# Patient Record
Sex: Male | Born: 1965 | Race: White | Hispanic: No | Marital: Married | Smoking: Current every day smoker
Health system: Southern US, Community
[De-identification: ages and names within clinical notes are randomized; demographics above are authoritative.]

## PROBLEM LIST (undated history)

## (undated) DIAGNOSIS — E785 Hyperlipidemia, unspecified: Secondary | ICD-10-CM

## (undated) DIAGNOSIS — I10 Essential (primary) hypertension: Secondary | ICD-10-CM

## (undated) DIAGNOSIS — I2510 Atherosclerotic heart disease of native coronary artery without angina pectoris: Secondary | ICD-10-CM

## (undated) HISTORY — DX: Atherosclerotic heart disease of native coronary artery without angina pectoris: I25.10

## (undated) HISTORY — DX: Essential (primary) hypertension: I10

## (undated) HISTORY — DX: Hyperlipidemia, unspecified: E78.5

---

## 2013-02-02 DIAGNOSIS — I2510 Atherosclerotic heart disease of native coronary artery without angina pectoris: Secondary | ICD-10-CM

## 2013-02-02 HISTORY — DX: Atherosclerotic heart disease of native coronary artery without angina pectoris: I25.10

## 2013-02-02 HISTORY — PX: CORONARY ANGIOPLASTY WITH STENT PLACEMENT: SHX49

## 2013-06-12 ENCOUNTER — Encounter (HOSPITAL_BASED_OUTPATIENT_CLINIC_OR_DEPARTMENT_OTHER): Payer: Self-pay | Admitting: Emergency Medicine

## 2013-06-12 ENCOUNTER — Emergency Department (HOSPITAL_BASED_OUTPATIENT_CLINIC_OR_DEPARTMENT_OTHER)
Admission: EM | Admit: 2013-06-12 | Discharge: 2013-06-12 | Disposition: A | Discharge status: Home / Self Care | Payer: No Typology Code available for payment source | Attending: Emergency Medicine | Admitting: Emergency Medicine

## 2013-06-12 DIAGNOSIS — Z7982 Long term (current) use of aspirin: Secondary | ICD-10-CM | POA: Insufficient documentation

## 2013-06-12 DIAGNOSIS — L509 Urticaria, unspecified: Secondary | ICD-10-CM | POA: Insufficient documentation

## 2013-06-12 DIAGNOSIS — F17200 Nicotine dependence, unspecified, uncomplicated: Secondary | ICD-10-CM | POA: Insufficient documentation

## 2013-06-12 DIAGNOSIS — Z79899 Other long term (current) drug therapy: Secondary | ICD-10-CM | POA: Insufficient documentation

## 2013-06-12 MED ORDER — PREDNISONE 50 MG PO TABS
60.0000 mg | ORAL_TABLET | Freq: Once | ORAL | Status: AC
  Administered 2013-06-12: 60 mg via ORAL
  Filled 2013-06-12: qty 1

## 2013-06-12 MED ORDER — FAMOTIDINE 20 MG PO TABS: 20.0000 mg | ORAL_TABLET | Freq: Two times a day (BID) | ORAL | Status: DC

## 2013-06-12 MED ORDER — PREDNISONE 20 MG PO TABS: ORAL_TABLET | ORAL | Status: DC

## 2013-06-12 MED ORDER — DIPHENHYDRAMINE HCL 25 MG PO TABS: 25.0000 mg | ORAL_TABLET | Freq: Four times a day (QID) | ORAL | Status: DC

## 2013-06-12 MED ORDER — FAMOTIDINE 20 MG PO TABS
20.0000 mg | ORAL_TABLET | Freq: Once | ORAL | Status: AC
  Administered 2013-06-12: 20 mg via ORAL
  Filled 2013-06-12: qty 1

## 2013-06-12 MED ORDER — DIPHENHYDRAMINE HCL 25 MG PO CAPS
25.0000 mg | ORAL_CAPSULE | Freq: Once | ORAL | Status: AC
  Administered 2013-06-12: 25 mg via ORAL
  Filled 2013-06-12: qty 1

## 2013-06-12 NOTE — ED Provider Notes (Signed)
Medical screening examination/treatment/procedure(s) were performed by non-physician practitioner and as supervising physician I was immediately available for consultation/collaboration.   Hoa Chistine Dematteo, MD 06/12/13 2214 

## 2013-06-12 NOTE — ED Provider Notes (Signed)
History    CSN: [id number] Arrival date & time 06/12/13  1659  First MD Initiated Contact with Patient 06/12/13 1707     Chief Complaint  Patient presents with  . Rash   (Consider location/radiation/quality/duration/timing/severity/associated sxs/prior Treatment) HPI  47 year old male presents for evaluation of hives.  Patient first noticed hives to both of his hands 2 days ago. Hives associate with redness and swelling to hands.  He also noticed hives to his left abdomen/left groin at the same time. Describe severe itching, minimally improved with taking Benadryl. Initially patient felt nauseated without vomiting or diarrhea but that has improved. No complaint of fever, headache, neck stiffness, chest pain, short of breath, trouble swallowing, throat swelling. Aside from pain.lays over the weekend he denies any other changes. Denies similar reaction in the past. No recent medication change, and no change in soap, detergent. He did recall trimming his dog hair over the weekend. Patient is here for further evaluation of the hives, however sts symptoms is improving.  No history of diabetes. Patient is currently on blood thinner medication due to prior cardiac stenting. Has been taking medication for the past several months without complications.   History reviewed. No pertinent past medical history. Past Surgical History  Procedure Laterality Date  . Coronary angioplasty with stent placement     No family history on file. History  Substance Use Topics  . Smoking status: Current Some Day Smoker  . Smokeless tobacco: Not on file  . Alcohol Use: Yes    Review of Systems  All other systems reviewed and are negative.    Allergies  Review of patient's allergies indicates no known allergies.  Home Medications   Current Outpatient Rx  Name  Route  Sig  Dispense  Refill  . aspirin 81 MG tablet   Oral   Take 81 mg by mouth daily.         Marland Kitchen atorvastatin (LIPITOR) 80 MG tablet  Oral   Take 80 mg by mouth at bedtime.         Marland Kitchen lisinopril (PRINIVIL,ZESTRIL) 20 MG tablet   Oral   Take 20 mg by mouth daily.         . metoprolol succinate (TOPROL-XL) 50 MG 24 hr tablet   Oral   Take 50 mg by mouth 2 (two) times daily. Take with or immediately following a meal.         . Ticagrelor (BRILINTA) 90 MG TABS tablet   Oral   Take 90 mg by mouth 2 (two) times daily.          BP 129/87  Pulse 81  Temp(Src) 97.6 F (36.4 C) (Oral)  Resp 16  Ht 6' (1.829 m)  Wt 226 lb (102.513 kg)  BMI 30.64 kg/m2  SpO2 97% Physical Exam  Nursing note and vitals reviewed. Constitutional: He is oriented to person, place, and time. He appears well-developed and well-nourished. No distress.  HENT:  Head: Atraumatic.  Mouth/Throat: Oropharynx is clear and moist.  No mucosal edema, no airway compromise  Eyes: Conjunctivae are normal.  Cardiovascular: Normal rate and regular rhythm.   Pulmonary/Chest: Effort normal and breath sounds normal. No respiratory distress. He has no wheezes.  Abdominal: Soft. There is no tenderness.  Musculoskeletal: Normal range of motion.  Neurological: He is alert and oriented to person, place, and time.  Skin: Rash (Hives noticed on both hands extending to the wrist, and also to left low anterior abdomen and left groin, and also to  R lower leg. No petechia, vesicular, or pustular lesion. Nontender on palpation.) noted.  Large ecchymosis noted to R lower thigh, pt aware and sts it's from taking blood thinner medication    ED Course  Procedures (including critical care time)  5:35 PM Patient with evidence of hives due to unknown causes. He is nontoxic in appearance with no airway compromise. I have reviewed this visit medication and does not think that his medication is responsible for this reaction. My plan is to give patient Benadryl, Pepcid, and prednisone as treatment. Care discussed with attending. Patient agrees to follow up with PCP for  further evaluation. Strict return precautions discussed. All questions answered to patient's satisfaction.  Labs Reviewed - No data to display No results found. 1. Localized hives     MDM  BP 129/87  Pulse 81  Temp(Src) 97.6 F (36.4 C) (Oral)  Resp 16  Ht 6' (1.829 m)  Wt 226 lb (102.513 kg)  BMI 30.64 kg/m2  SpO2 97%   Fayrene Helper, PA-C 06/12/13 1739

## 2013-06-12 NOTE — ED Notes (Addendum)
Redness, pain and swelling on hands and low trunk since yesterday. No hx of similar reaction. Last took Benadryl at 1500.

## 2013-09-27 ENCOUNTER — Ambulatory Visit (INDEPENDENT_AMBULATORY_CARE_PROVIDER_SITE_OTHER): Payer: No Typology Code available for payment source | Admitting: Cardiovascular Disease

## 2013-09-27 ENCOUNTER — Encounter: Payer: Self-pay | Admitting: Cardiovascular Disease

## 2013-09-27 VITALS — BP 108/74 | HR 64 | Ht 71.0 in | Wt 223.0 lb

## 2013-09-27 DIAGNOSIS — Z0389 Encounter for observation for other suspected diseases and conditions ruled out: Secondary | ICD-10-CM

## 2013-09-27 DIAGNOSIS — I10 Essential (primary) hypertension: Secondary | ICD-10-CM

## 2013-09-27 DIAGNOSIS — I2510 Atherosclerotic heart disease of native coronary artery without angina pectoris: Secondary | ICD-10-CM

## 2013-09-27 DIAGNOSIS — E785 Hyperlipidemia, unspecified: Secondary | ICD-10-CM | POA: Insufficient documentation

## 2013-09-27 NOTE — Patient Instructions (Addendum)
Your physician wants you to follow-up in: 6 MONTHS with Dr Excell Seltzer.  You will receive a reminder letter in the mail two months in advance. If you don't receive a letter, please call our office to schedule the follow-up appointment.  Your physician recommends that you return for a FASTING LIPID, LIVER and BMP--nothing to eat or drink after midnight, lab opens at 7:30.   You have been referred to Primary Care at [facility] (Dr Drue Novel)

## 2013-09-27 NOTE — Progress Notes (Signed)
HPI:  47 year-old gentleman presenting for cardiac evaluation. 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 He was treated with PCI of the left circumflex and had nonobstructive CAD elsewhere. Based on his description of symptoms, he presented late into the course of his event. MI occurred when he was living in [place], [place] and he has recently relocated to [place].   Records reviewed and show that he was treated with a DES (3.0x20 mm Promus post-dil to 3.5). No documentation of LVEF.   The patient has tolerated post-MI med Rx well. He is on ASA and brilinta for antiplatelet Rx.   From a symptomatic perspective he is doing well without complaints. No chest pain, dyspnea, edema, palpitations, lightheadedness, or syncope. He walks for exercise without exertional symptoms.  Outpatient Encounter Prescriptions as of 09/27/2013  Medication Sig Dispense Refill  . aspirin 81 MG tablet Take 81 mg by mouth daily.      Marland Kitchen esomeprazole (NEXIUM) 20 MG capsule Take 20 mg by mouth daily before breakfast. Take one tab      . lisinopril (PRINIVIL,ZESTRIL) 20 MG tablet Take 20 mg by mouth daily.      . metoprolol succinate (TOPROL-XL) 50 MG 24 hr tablet Take 50 mg by mouth 2 (two) times daily. Take with or immediately following a meal.      . Multiple Vitamin (MULTIVITAMIN) tablet Take 1 tablet by mouth daily. One tab      . potassium gluconate (EQL POTASSIUM GLUCONATE) 595 MG TABS tablet Take 595 mg by mouth daily. ONE TAB DAILY      . predniSONE (DELTASONE) 20 MG tablet 3 Tabs PO Days 1-3, then 2 tabs PO Days 4-6, then 1 tab PO Day 7-9, then Half Tab PO Day 10-12  20 tablet  0  . rosuvastatin (CRESTOR) 20 MG tablet Take 20 mg by mouth daily. ONE TAB      . Ticagrelor (BRILINTA) 90 MG TABS tablet Take 90 mg by mouth 2 (two) times daily.      . diphenhydrAMINE (BENADRYL) 25 MG tablet Take 1 tablet (25 mg total) by mouth every 6 (six) hours.  20 tablet  0  . [DISCONTINUED]  atorvastatin (LIPITOR) 80 MG tablet Take 80 mg by mouth at bedtime.      . [DISCONTINUED] famotidine (PEPCID) 20 MG tablet Take 1 tablet (20 mg total) by mouth 2 (two) times daily.  30 tablet  0   No facility-administered encounter medications on file as of 09/27/2013.    Review of patient's allergies indicates no known allergies.  Past Medical History  Diagnosis Date  . Coronary atherosclerosis of native coronary artery March 2014    inferoposterior MI, DES - left circumflex  . HTN (hypertension)   . Hyperlipidemia     Past Surgical History  Procedure Laterality Date  . Coronary angioplasty with stent placement      History   Social History  . Marital Status: Single    Spouse Name: N/A    Number of Children: N/A  . Years of Education: N/A   Occupational History  . Not on file.   Social History Main Topics  . Smoking status: Current Some Day Smoker  . Smokeless tobacco: Not on file  . Alcohol Use: Yes  . Drug Use: No  . Sexual Activity: Not on file   Other Topics Concern  . Not on file   Social History Narrative   Retired Public relations account executive. Quit smoking August  2014. 2-3 drinks per day. Exercises regularly. 4 children.    Family History  Problem Relation Age of Onset  . Coronary artery disease Father 73    died of MI    ROS:  General: no fevers/chills/night sweats Eyes: no blurry vision, diplopia, or amaurosis ENT: no sore throat or hearing loss Resp: no cough, wheezing, or hemoptysis CV: no edema or palpitations GI: no abdominal pain, nausea, vomiting, diarrhea, or constipation. Positive for reflux symptoms GU: no dysuria, frequency, or hematuria Skin: no rash Neuro: no headache, numbness, tingling, or weakness of extremities Musculoskeletal: no joint pain or swelling Heme: no bleeding, DVT, or easy bruising Endo: no polydipsia or polyuria Psych: positive for anxiety/depression  BP 108/74  Pulse 64  Ht 5\' 11"  (1.803 m)  Wt 223 lb (101.152 kg)   BMI 31.12 kg/m2  PHYSICAL EXAM: Pt is alert and oriented, WD, WN, in no distress. HEENT: normal Neck: JVP normal. Carotid upstrokes normal without bruits. No thyromegaly. Lungs: equal expansion, clear bilaterally CV: Apex is discrete and nondisplaced, RRR without murmur or gallop Abd: soft, NT, +BS, no bruit, no hepatosplenomegaly Back: no CVA tenderness Ext: no C/C/E        DP/PT pulses intact and = Skin: warm and dry without rash Neuro: CNII-XII intact             Strength intact = bilaterally  EKG:  Sinus rhythm 64 beats per minute, age-indeterminate inferoposterior MI.  ASSESSMENT AND PLAN: 1. CAD, native vessel. Stable without anginal symptoms. Med Rx reviewed and he is on appropriate meds. He should continue brilinta through March 2015 and I will see him in follow-up before discontinuation of this. LVEF not documented but no evidence of significant problems. He is Class 1 and seems to be completely asymptomatic on beta-blocker/ACE.  2. HTN - BP controlled on metoprolol and lisinopril.  3. Hyperlipidemia - on crestor. Will check lipids and lft's prior to his return visit in 6 months.  4. Tobacco - recently quit.    For follow-up I will see him in 6 months. Will write for med refills when needed. Pt given names of PCP's in area.   Tonny Bollman 09/29/2013 11:45 PM

## 2013-09-29 ENCOUNTER — Encounter: Payer: Self-pay | Admitting: Cardiovascular Disease

## 2013-10-25 ENCOUNTER — Other Ambulatory Visit (INDEPENDENT_AMBULATORY_CARE_PROVIDER_SITE_OTHER): Payer: No Typology Code available for payment source

## 2013-10-25 DIAGNOSIS — I2510 Atherosclerotic heart disease of native coronary artery without angina pectoris: Secondary | ICD-10-CM

## 2013-10-25 DIAGNOSIS — Z0389 Encounter for observation for other suspected diseases and conditions ruled out: Secondary | ICD-10-CM

## 2013-10-25 DIAGNOSIS — E785 Hyperlipidemia, unspecified: Secondary | ICD-10-CM

## 2013-10-25 DIAGNOSIS — I10 Essential (primary) hypertension: Secondary | ICD-10-CM

## 2013-10-25 LAB — HEPATIC FUNCTION PANEL
ALT: 49 U/L (ref 0–53)
AST: 38 U/L — ABNORMAL HIGH (ref 0–37)
Alkaline Phosphatase: 58 U/L (ref 39–117)
Bilirubin, Direct: 0.1 mg/dL (ref 0.0–0.3)
Total Bilirubin: 0.9 mg/dL (ref 0.3–1.2)

## 2013-10-25 LAB — BASIC METABOLIC PANEL
BUN: 16 mg/dL (ref 6–23)
Calcium: 9.6 mg/dL (ref 8.4–10.5)
Creatinine, Ser: 1.2 mg/dL (ref 0.4–1.5)
GFR: 66.13 mL/min (ref >60.00)
Glucose, Bld: 111 mg/dL — ABNORMAL HIGH (ref 70–99)
Potassium: 4.6 mEq/L (ref 3.5–5.1)

## 2013-10-28 ENCOUNTER — Other Ambulatory Visit: Payer: Self-pay | Admitting: *Deleted

## 2013-10-28 ENCOUNTER — Telehealth: Payer: Self-pay | Admitting: *Deleted

## 2013-10-28 MED ORDER — TICAGRELOR 90 MG PO TABS: 90.0000 mg | ORAL_TABLET | Freq: Two times a day (BID) | ORAL | Status: DC

## 2013-10-28 MED ORDER — ROSUVASTATIN CALCIUM 20 MG PO TABS: 20.0000 mg | ORAL_TABLET | Freq: Every day | ORAL | Status: DC

## 2013-10-28 MED ORDER — LISINOPRIL 20 MG PO TABS: 20.0000 mg | ORAL_TABLET | Freq: Every day | ORAL | Status: DC

## 2013-10-28 MED ORDER — METOPROLOL TARTRATE 100 MG PO TABS: 100.0000 mg | ORAL_TABLET | Freq: Two times a day (BID) | ORAL | Status: DC

## 2013-10-28 NOTE — Telephone Encounter (Signed)
Complete

## 2013-10-28 NOTE — Telephone Encounter (Signed)
Patient called for metoprolol refill. Our office list says metop succ 50mg  bid, but patient states that he takes metop tart 100mg  bid. Please advise. Thanks, MI

## 2013-10-28 NOTE — Telephone Encounter (Signed)
Please update the pt's medication list with Metoprolol Tartrate 100mg  bid (D/C Metoprolol Succinate) and this is okay to refill. Thank you.

## 2013-12-12 ENCOUNTER — Telehealth: Payer: Self-pay

## 2013-12-12 NOTE — Telephone Encounter (Signed)
Medication List and allergies:  Reviewed and updated  90 day supply/mail order: [facility] [address] Local prescriptions:   Immunizations due: declines flu  A/P:   No changes to FH or PSH or personal hx Tdap--5-7 years ago  To Discuss with Provider: Vitamins, healthy living, working out

## 2013-12-13 ENCOUNTER — Encounter: Payer: Self-pay | Admitting: Family Medicine

## 2013-12-13 ENCOUNTER — Encounter: Payer: Self-pay | Admitting: Gastroenterology

## 2013-12-13 ENCOUNTER — Ambulatory Visit (INDEPENDENT_AMBULATORY_CARE_PROVIDER_SITE_OTHER): Payer: No Typology Code available for payment source | Admitting: Family Medicine

## 2013-12-13 VITALS — BP 120/76 | HR 69 | Temp 97.8°F | Resp 16 | Ht 72.0 in | Wt 235.0 lb

## 2013-12-13 DIAGNOSIS — I2510 Atherosclerotic heart disease of native coronary artery without angina pectoris: Secondary | ICD-10-CM

## 2013-12-13 DIAGNOSIS — R1310 Dysphagia, unspecified: Secondary | ICD-10-CM | POA: Insufficient documentation

## 2013-12-13 DIAGNOSIS — I10 Essential (primary) hypertension: Secondary | ICD-10-CM

## 2013-12-13 DIAGNOSIS — E669 Obesity, unspecified: Secondary | ICD-10-CM

## 2013-12-13 DIAGNOSIS — R1319 Other dysphagia: Secondary | ICD-10-CM | POA: Insufficient documentation

## 2013-12-13 DIAGNOSIS — E785 Hyperlipidemia, unspecified: Secondary | ICD-10-CM

## 2013-12-13 DIAGNOSIS — K219 Gastro-esophageal reflux disease without esophagitis: Secondary | ICD-10-CM | POA: Insufficient documentation

## 2013-12-13 DIAGNOSIS — R1314 Dysphagia, pharyngoesophageal phase: Secondary | ICD-10-CM

## 2013-12-13 DIAGNOSIS — Z23 Encounter for immunization: Secondary | ICD-10-CM

## 2013-12-13 LAB — H. PYLORI ANTIBODY, IGG: H PYLORI IGG: NEGATIVE

## 2013-12-13 LAB — HEMOGLOBIN A1C: Hgb A1c MFr Bld: 6 % (ref 4.6–6.5)

## 2013-12-13 LAB — BASIC METABOLIC PANEL
BUN: 16 mg/dL (ref 6–23)
CHLORIDE: 103 meq/L (ref 96–112)
CO2: 30 mEq/L (ref 19–32)
Calcium: 9.1 mg/dL (ref 8.4–10.5)
Creatinine, Ser: 1.3 mg/dL (ref 0.4–1.5)
GFR: 65.48 mL/min (ref >60.00)
GLUCOSE: 115 mg/dL — AB (ref 70–99)
POTASSIUM: 4.1 meq/L (ref 3.5–5.1)
SODIUM: 139 meq/L (ref 135–145)

## 2013-12-13 MED ORDER — PANTOPRAZOLE SODIUM 40 MG PO TBEC: 40.0000 mg | DELAYED_RELEASE_TABLET | Freq: Every day | ORAL | Status: DC

## 2013-12-13 NOTE — Patient Instructions (Signed)
Schedule your complete physical in 6 months We'll notify you of your lab results and make any changes if needed Make healthy food choices and regular exercise Call with any questions or concerns Welcome!  We're glad to have you!

## 2013-12-13 NOTE — Assessment & Plan Note (Signed)
New.  Pt has gained weight since stopping smoking.  Plans to focus on healthy diet and regular exercise.  Applauded his commitment.  Will follow.

## 2013-12-13 NOTE — Progress Notes (Signed)
   Subjective:    Patient ID: Norman Anderson, male    DOB: 07/04/1966, 48 y.o.   MRN: [id number]  HPI New to establish.  Recently moved to area.  MI- occurred in 3/14.  Now seeing Dr Burt Knack.  On Brilinta, Crestor, Lisinopril, and Metoprolol.  No recent CP.  Intermittent SOB- has gained weight since QUITTING SMOKING!!  Denies HAs, visual changes.  GERD- on Nexium but has been having severe sxs 'for a couple months'.  Reports he is regularly having difficulty swallowing food or water, 'it just ties up right in my chest'.  GERD is waking pt at night, occuring regardless of what he eats.  Obesity- pt has gained weight since quitting smoking.  Plans to re-focus on healthy diet and regular exercise.   Review of Systems For ROS see HPI     Objective:   Physical Exam  Vitals reviewed. Constitutional: He is oriented to person, place, and time. He appears well-developed and well-nourished. No distress.  HENT:  Head: Normocephalic and atraumatic.  Eyes: Conjunctivae and EOM are normal. Pupils are equal, round, and reactive to light.  Neck: Normal range of motion. Neck supple. No thyromegaly present.  Cardiovascular: Normal rate, regular rhythm, normal heart sounds and intact distal pulses.   No murmur heard. Pulmonary/Chest: Effort normal and breath sounds normal. No respiratory distress.  Abdominal: Soft. Bowel sounds are normal. He exhibits no distension.  Musculoskeletal: He exhibits no edema.  Lymphadenopathy:    He has no cervical adenopathy.  Neurological: He is alert and oriented to person, place, and time. No cranial nerve deficit.  Skin: Skin is warm and dry.  Psychiatric: He has a normal mood and affect. His behavior is normal.          Assessment & Plan:

## 2013-12-13 NOTE — Progress Notes (Signed)
Pre visit review using our clinic review tool, if applicable. No additional management support is needed unless otherwise documented below in the visit note. 

## 2013-12-13 NOTE — Assessment & Plan Note (Signed)
New.  tx GERD and refer to GI.  Will follow.

## 2013-12-13 NOTE — Assessment & Plan Note (Signed)
New to provider, ongoing for pt.  Following w/ Dr Copper.  Will follow along and assist w/ risk reduction as much as possible.

## 2013-12-13 NOTE — Assessment & Plan Note (Signed)
Chronic problem.  Tolerating statin w/out difficulty.  Reviewed recent labs.  Will follow.

## 2013-12-13 NOTE — Assessment & Plan Note (Signed)
New to provider, ongoing for pt.  On Nexium 20mg  w/out relief.  Switch to Protonix and increase to 40mg  daily.  Reviewed dietary and lifestyle modifications

## 2013-12-13 NOTE — Assessment & Plan Note (Signed)
New to provider, chronic for pt.  Adequate control.  Asymptomatic.  Will follow- no changes.

## 2013-12-23 ENCOUNTER — Telehealth: Payer: Self-pay | Admitting: *Deleted

## 2013-12-23 ENCOUNTER — Ambulatory Visit (INDEPENDENT_AMBULATORY_CARE_PROVIDER_SITE_OTHER): Payer: No Typology Code available for payment source | Admitting: Gastroenterology

## 2013-12-23 ENCOUNTER — Encounter: Payer: Self-pay | Admitting: Gastroenterology

## 2013-12-23 VITALS — BP 92/62 | HR 64 | Ht 72.0 in | Wt 233.0 lb

## 2013-12-23 DIAGNOSIS — K219 Gastro-esophageal reflux disease without esophagitis: Secondary | ICD-10-CM

## 2013-12-23 DIAGNOSIS — R1310 Dysphagia, unspecified: Secondary | ICD-10-CM

## 2013-12-23 DIAGNOSIS — R1319 Other dysphagia: Secondary | ICD-10-CM

## 2013-12-23 DIAGNOSIS — R1314 Dysphagia, pharyngoesophageal phase: Secondary | ICD-10-CM

## 2013-12-23 NOTE — Progress Notes (Signed)
_                                                                                                                History of Present Illness: 48 year old white male with history of coronary artery disease, status post placement of a drug-eluting stent in March, 2014, on Brelinta, referred for evaluation of dysphagia.  For several months he's had dysphagia to solids and, more recently, liquids.  He complains of pyrosis which is partially relieved with Protonix.  He's had what sounds like minor food impactions.  Weight is stable.    Past Medical History  Diagnosis Date  . Coronary atherosclerosis of native coronary artery March 2014    inferoposterior MI, DES - left circumflex  . HTN (hypertension)   . Hyperlipidemia    Past Surgical History  Procedure Laterality Date  . Coronary angioplasty with stent placement     family history includes Coronary artery disease (age of onset: 27) in his father; Heart attack in his father. Current Outpatient Prescriptions  Medication Sig Dispense Refill  . Ascorbic Acid (VITAMIN C PO) Take by mouth.      Marland Kitchen aspirin 81 MG tablet Take 81 mg by mouth daily.      . B Complex-C (SUPER B COMPLEX PO) Take by mouth.      . esomeprazole (NEXIUM) 20 MG capsule Take 20 mg by mouth daily before breakfast. Take one tab      . Flaxseed, Linseed, (FLAXSEED OIL PO) Take by mouth.      Marland Kitchen lisinopril (PRINIVIL,ZESTRIL) 20 MG tablet Take 1 tablet (20 mg total) by mouth daily.  90 tablet  1  . metoprolol (LOPRESSOR) 100 MG tablet Take 1 tablet (100 mg total) by mouth 2 (two) times daily.  180 tablet  1  . Multiple Vitamin (MULTIVITAMIN) tablet Take 1 tablet by mouth daily. One tab      . pantoprazole (PROTONIX) 40 MG tablet Take 1 tablet (40 mg total) by mouth daily.  30 tablet  3  . potassium gluconate (EQL POTASSIUM GLUCONATE) 595 MG TABS tablet Take 595 mg by mouth daily. ONE TAB DAILY      . rosuvastatin (CRESTOR) 20 MG tablet Take 1 tablet (20 mg  total) by mouth daily. ONE TAB  90 tablet  1  . Ticagrelor (BRILINTA) 90 MG TABS tablet Take 1 tablet (90 mg total) by mouth 2 (two) times daily.  180 tablet  1  . WHEY PROTEIN PO Take by mouth.       No current facility-administered medications for this visit.   Allergies as of 12/23/2013  . (No Known Allergies)    reports that he quit smoking about 5 months ago. His smoking use included Cigarettes. He smoked 0.00 packs per day. He has never used smokeless tobacco. He reports that he drinks alcohol. He reports that he does not use illicit drugs.     Review of Systems: Pertinent positive and negative review of systems were noted in the above HPI section. All  other review of systems were otherwise negative.  Vital signs were reviewed in today's medical record Physical Exam: General: Well developed , well nourished, no acute distress Skin: anicteric Head: Normocephalic and atraumatic Eyes:  sclerae anicteric, EOMI Ears: Normal auditory acuity Mouth: No deformity or lesions Neck: Supple, no masses or thyromegaly Lungs: Clear throughout to auscultation Heart: Regular rate and rhythm; no murmurs, rubs or bruits Abdomen: Soft, non tender and non distended. No masses, hepatosplenomegaly or hernias noted. Normal Bowel sounds Rectal:deferred Musculoskeletal: Symmetrical with no gross deformities  Skin: No lesions on visible extremities Pulses:  Normal pulses noted Extremities: No clubbing, cyanosis, edema or deformities noted Neurological: Alert oriented x 4, grossly nonfocal Cervical Nodes:  No significant cervical adenopathy Inguinal Nodes: No significant inguinal adenopathy Psychological:  Alert and cooperative. Normal mood and affect  See Assessment and Plan under Problem List

## 2013-12-23 NOTE — Telephone Encounter (Signed)
  12/23/2013   RE: Norman Anderson DOB: Dec 07, 1965 MRN: [id number]   Dear Burt Knack,    We have scheduled the above patient for an endoscopic procedure. Our records show that he is on anticoagulation therapy.   Please advise as to how long the patient may come off his therapy of Brilinta prior to the procedure, which is scheduled for 01/16/2014.  Please fax back/ or route the completed form to [facility] at [phone].   Sincerely,    Genella Mech

## 2013-12-23 NOTE — Patient Instructions (Signed)
You have been scheduled for an endoscopy with propofol. Please follow written instructions given to you at your visit today. If you use inhalers (even only as needed), please bring them with you on the day of your procedure. Your physician has requested that you go to www.startemmi.com and enter the access code given to you at your visit today. This web site gives a general overview about your procedure. However, you should still follow specific instructions given to you by our office regarding your preparation for the procedure.  PLEASE CONTACT OUR OFFICE IF YOU HAVE NOT HEARD FROM Norman Anderson WITHIN A WEEK OF YOUR PROCEDURE ABOUT HOLDING YOUR BRILINTA

## 2013-12-23 NOTE — Assessment & Plan Note (Signed)
Symptoms are well-controlled with protonix.

## 2013-12-23 NOTE — Telephone Encounter (Signed)
Pt had an MI treated with drug-eluting stent in March 2014. He should be on 12 months of ASA and brilinta without interruption unless endoscopy is urgent. After March 2015, he can hold Brilinta 5 days prior to endoscopy and resume when safe from bleeding risk perspective.   thx  Sherren Mocha 12/23/2013 4:30 PM

## 2013-12-23 NOTE — Assessment & Plan Note (Signed)
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  Recommendations #1 upper endoscopy with dilatation as indicated.  I'll check with the patient's cardiologist whether we can hold Brelinta prior to the procedure.  The risk of holding anticoagulation therapy or antiplatelet medications was discussed including the increased risk for thromboembolic disease that may include DVT, pulmonary emboli and stroke. The patient understands this risk and is willing to proceed with temporally holding the medication provided that this is approved by her PCP or cardiologist.

## 2013-12-23 NOTE — Telephone Encounter (Signed)
Dr Deatra Ina, the only way Dr Burt Knack is going to hold brilinta is if the EGD is urgent Otherwise he has to wait till after his therapy is complete What do you want to do??

## 2013-12-24 NOTE — Telephone Encounter (Signed)
Please convey Dr. Antionette Char comments to the patient.  We'll have to wait until he completes therapy in March.  He can schedule a dilitation anytime after March while holding Brelinta for 5 days..  If he can't manage with a soft diet have him contact me.

## 2013-12-25 NOTE — Telephone Encounter (Signed)
Tried to contact patient  Voice mailbox not set up

## 2013-12-31 NOTE — Telephone Encounter (Signed)
Explained to patient that we have to cancel his procedure and that he is to contact our office when he completes his blood thinner  Patient understands

## 2014-01-16 ENCOUNTER — Encounter: Payer: No Typology Code available for payment source | Admitting: Gastroenterology

## 2014-02-10 ENCOUNTER — Other Ambulatory Visit: Payer: Self-pay | Admitting: General Practice

## 2014-02-10 DIAGNOSIS — K219 Gastro-esophageal reflux disease without esophagitis: Secondary | ICD-10-CM

## 2014-02-10 MED ORDER — PANTOPRAZOLE SODIUM 40 MG PO TBEC: 40.0000 mg | DELAYED_RELEASE_TABLET | Freq: Every day | ORAL | Status: DC

## 2014-02-11 ENCOUNTER — Encounter (HOSPITAL_COMMUNITY): Payer: Self-pay | Admitting: Cardiovascular Disease

## 2014-05-02 ENCOUNTER — Other Ambulatory Visit: Payer: Self-pay | Admitting: Cardiovascular Disease

## 2014-05-22 ENCOUNTER — Encounter: Payer: Self-pay | Admitting: Cardiovascular Disease

## 2014-05-22 ENCOUNTER — Ambulatory Visit (INDEPENDENT_AMBULATORY_CARE_PROVIDER_SITE_OTHER): Payer: No Typology Code available for payment source | Admitting: Cardiovascular Disease

## 2014-05-22 VITALS — BP 124/72 | HR 56 | Ht 72.0 in | Wt 240.1 lb

## 2014-05-22 DIAGNOSIS — I2510 Atherosclerotic heart disease of native coronary artery without angina pectoris: Secondary | ICD-10-CM

## 2014-05-22 DIAGNOSIS — E785 Hyperlipidemia, unspecified: Secondary | ICD-10-CM

## 2014-05-22 DIAGNOSIS — I10 Essential (primary) hypertension: Secondary | ICD-10-CM

## 2014-05-22 NOTE — Progress Notes (Signed)
    HPI: 48 year old gentleman presenting for followup evaluation. 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 He was treated with PCI of the left circumflex and had nonobstructive CAD elsewhere. Based on his description of symptoms, he presented late into the course of his event.  The patient is doing very well. He discontinue brilinta when he was 12 months out from his MI. He is walking regularly for exercise. He has occasional discomfort in his left upper chest and shoulder when he walks, but he attributes this to an old crush injury. He really feels quite well and has no dyspnea, substernal pain, palpitations, or lightheadedness. He has no symptoms reminiscent of those at the time of his MI.  Outpatient Encounter Prescriptions as of 05/22/2014  Medication Sig  . Ascorbic Acid (VITAMIN C PO) Take by mouth.  Marland Kitchen aspirin 81 MG tablet Take 81 mg by mouth daily.  . B Complex-C (SUPER B COMPLEX PO) Take by mouth.  . Flaxseed, Linseed, (FLAXSEED OIL PO) Take by mouth.  Marland Kitchen lisinopril (PRINIVIL,ZESTRIL) 20 MG tablet Take 1 tablet (20 mg total) by mouth daily.  . metoprolol (LOPRESSOR) 100 MG tablet TAKE 1 TABLET BY MOUTH TWICE A DAY  . Multiple Vitamin (MULTIVITAMIN) tablet Take 1 tablet by mouth daily. One tab  . Omega 3-6-9 Fatty Acids (OMEGA 3-6-9 COMPLEX PO) Take 1,400 tablets by mouth once.  . pantoprazole (PROTONIX) 40 MG tablet Take 1 tablet (40 mg total) by mouth daily.  . potassium gluconate (EQL POTASSIUM GLUCONATE) 595 MG TABS tablet Take 595 mg by mouth daily. ONE TAB DAILY  . rosuvastatin (CRESTOR) 20 MG tablet Take 1 tablet (20 mg total) by mouth daily. ONE TAB  . WHEY PROTEIN PO Take by mouth.  . [DISCONTINUED] esomeprazole (NEXIUM) 20 MG capsule Take 20 mg by mouth daily before breakfast. Take one tab  . [DISCONTINUED] Ticagrelor (BRILINTA) 90 MG TABS tablet Take 1 tablet (90 mg total) by mouth 2 (two) times daily.    No Known Allergies  Past Medical  History  Diagnosis Date  . Coronary atherosclerosis of native coronary artery March 2014    inferoposterior MI, DES - left circumflex  . HTN (hypertension)   . Hyperlipidemia     ROS: Negative except as per HPI  BP 124/72  Pulse 56  Ht 6' (1.829 m)  Wt 108.918 kg (240 lb 1.9 oz)  BMI 32.56 kg/m2  PHYSICAL EXAM: Pt is alert and oriented, NAD HEENT: normal Neck: JVP - normal, carotids 2+= without bruits Lungs: CTA bilaterally CV: RRR without murmur or gallop Abd: soft, NT, Positive BS, no hepatomegaly Ext: no C/C/E, distal pulses intact and equal Skin: warm/dry no rash  EKG:  Sinus bradycardia 56 beats per minute, age-indeterminate inferoposterior MI.  ASSESSMENT AND PLAN: 1. Coronary artery disease, native vessel. The patient is stable without symptoms of angina. He will continue on his current medical program which includes aspirin, and ACE inhibitor, beta blocker, and a statin drug. No changes were made to his medical program today.  2. Essential hypertension. His blood pressure is well controlled on his current medical therapy.  3. Hyperlipidemia. The patient continues on Crestor. He will have upcoming lab work with his annual physical exam. We discussed the importance of diet and exercise as it relates to all of his cardiovascular problems.  I will plan on seeing him back in one year for followup evaluation.  Sherren Mocha 05/22/2014 6:19 PM

## 2014-05-22 NOTE — Patient Instructions (Signed)
Your physician wants you to follow-up in: 1 YEAR with Dr Cooper.  You will receive a reminder letter in the mail two months in advance. If you don't receive a letter, please call our office to schedule the follow-up appointment.  Your physician recommends that you continue on your current medications as directed. Please refer to the Current Medication list given to you today.  

## 2014-06-12 ENCOUNTER — Encounter: Payer: No Typology Code available for payment source | Admitting: Family Medicine

## 2014-08-03 ENCOUNTER — Other Ambulatory Visit: Payer: Self-pay | Admitting: Family Medicine

## 2014-08-03 ENCOUNTER — Other Ambulatory Visit: Payer: Self-pay | Admitting: Cardiovascular Disease

## 2014-08-04 NOTE — Telephone Encounter (Signed)
Med filled.  

## 2014-08-28 ENCOUNTER — Encounter: Payer: No Typology Code available for payment source | Admitting: Family Medicine

## 2014-11-04 ENCOUNTER — Other Ambulatory Visit: Payer: Self-pay | Admitting: Cardiovascular Disease

## 2015-02-01 ENCOUNTER — Other Ambulatory Visit: Payer: Self-pay | Admitting: Cardiovascular Disease

## 2015-02-02 ENCOUNTER — Telehealth: Payer: Self-pay | Admitting: Family Medicine

## 2015-02-02 ENCOUNTER — Other Ambulatory Visit: Payer: Self-pay | Admitting: General Practice

## 2015-02-02 MED ORDER — PANTOPRAZOLE SODIUM 40 MG PO TBEC: 40.0000 mg | DELAYED_RELEASE_TABLET | Freq: Every day | ORAL | Status: DC

## 2015-02-02 NOTE — Telephone Encounter (Signed)
Pt due for CPE- ok for #30, 1 refill

## 2015-02-02 NOTE — Telephone Encounter (Signed)
Called PT to make appointment for CPE per Dr. Virgil Benedict request- LM on home CBR

## 2015-02-02 NOTE — Telephone Encounter (Signed)
Last OV 12-13-13 Pantoprazole last filled 08/04/14 #90 with 1   No upcoming appts

## 2015-04-09 ENCOUNTER — Other Ambulatory Visit: Payer: Self-pay | Admitting: Family Medicine

## 2015-04-09 NOTE — Telephone Encounter (Signed)
Med filled 30 days only, pt needs a BP and cholesterol follow up before his CPE in September.

## 2015-04-26 ENCOUNTER — Other Ambulatory Visit: Payer: Self-pay | Admitting: Family Medicine

## 2015-04-27 NOTE — Telephone Encounter (Signed)
Med denied, pt is in need of a follow up appt before his CPE.

## 2015-04-29 ENCOUNTER — Encounter: Payer: Self-pay | Admitting: Family Medicine

## 2015-04-29 ENCOUNTER — Ambulatory Visit (INDEPENDENT_AMBULATORY_CARE_PROVIDER_SITE_OTHER): Payer: No Typology Code available for payment source | Admitting: Family Medicine

## 2015-04-29 VITALS — BP 122/80 | HR 67 | Temp 98.0°F | Resp 16 | Wt 255.5 lb

## 2015-04-29 DIAGNOSIS — I10 Essential (primary) hypertension: Secondary | ICD-10-CM | POA: Diagnosis not present

## 2015-04-29 DIAGNOSIS — K219 Gastro-esophageal reflux disease without esophagitis: Secondary | ICD-10-CM

## 2015-04-29 DIAGNOSIS — E785 Hyperlipidemia, unspecified: Secondary | ICD-10-CM

## 2015-04-29 MED ORDER — PANTOPRAZOLE SODIUM 40 MG PO TBEC: DELAYED_RELEASE_TABLET | ORAL | Status: DC

## 2015-04-29 NOTE — Progress Notes (Signed)
Pre visit review using our clinic review tool, if applicable. No additional management support is needed unless otherwise documented below in the visit note. 

## 2015-04-29 NOTE — Assessment & Plan Note (Signed)
Chronic problem.  Tolerating statin w/o difficulty.  Check labs.  Adjust meds prn  

## 2015-04-29 NOTE — Assessment & Plan Note (Signed)
Chronic problem.  Well controlled.  Asymptomatic.  Check labs.  No anticipated med changes. 

## 2015-04-29 NOTE — Progress Notes (Signed)
   Subjective:    Patient ID: Norman Anderson, male    DOB: 1966-05-29, 49 y.o.   MRN: [id number]  HPI HTN- chronic problem, on Lisinopril, Metoprolol.  No CP, SOB, HAs, visual changes, edema.  Walking 5 miles nightly  Hyperlipidemia- chronic problem, on Crestor.  No abd pain, N/V.  GERD- chronic problem, on Protonix.  sxs are well controlled on current meds but when pt stops meds, severe sxs return.  Has been up at night due to reflux.  when on meds, able to eat pretty much everything and able to sleep w/o difficulty.    Review of Systems For ROS see HPI     Objective:   Physical Exam  Constitutional: He is oriented to person, place, and time. He appears well-developed and well-nourished. No distress.  HENT:  Head: Normocephalic and atraumatic.  Eyes: Conjunctivae and EOM are normal. Pupils are equal, round, and reactive to light.  Neck: Normal range of motion. Neck supple. No thyromegaly present.  Cardiovascular: Normal rate, regular rhythm, normal heart sounds and intact distal pulses.   No murmur heard. Pulmonary/Chest: Effort normal and breath sounds normal. No respiratory distress.  Abdominal: Soft. Bowel sounds are normal. He exhibits no distension.  Musculoskeletal: He exhibits no edema.  Lymphadenopathy:    He has no cervical adenopathy.  Neurological: He is alert and oriented to person, place, and time. No cranial nerve deficit.  Skin: Skin is warm and dry.  Psychiatric: He has a normal mood and affect. His behavior is normal.  Vitals reviewed.         Assessment & Plan:

## 2015-04-29 NOTE — Assessment & Plan Note (Signed)
sxs are well controlled when on protonix but returned as soon as he stopped medication.  Refill provided today.  Reviewed dietary and lifestyle modifications.

## 2015-04-29 NOTE — Patient Instructions (Signed)
Follow up as scheduled for your physical We'll notify you of your lab results and make any changes if needed Keep up the good work!  You look great! Call with any questions or concerns Enjoy your holiday weekend!!!

## 2015-04-30 ENCOUNTER — Other Ambulatory Visit (INDEPENDENT_AMBULATORY_CARE_PROVIDER_SITE_OTHER): Payer: No Typology Code available for payment source

## 2015-04-30 DIAGNOSIS — R739 Hyperglycemia, unspecified: Secondary | ICD-10-CM

## 2015-04-30 LAB — HEPATIC FUNCTION PANEL
ALK PHOS: 60 U/L (ref 39–117)
ALT: 34 U/L (ref 0–53)
AST: 28 U/L (ref 0–37)
Albumin: 4.2 g/dL (ref 3.5–5.2)
BILIRUBIN DIRECT: 0.1 mg/dL (ref 0.0–0.3)
Total Bilirubin: 0.5 mg/dL (ref 0.2–1.2)
Total Protein: 7 g/dL (ref 6.0–8.3)

## 2015-04-30 LAB — CBC WITH DIFFERENTIAL/PLATELET
BASOS ABS: 0.1 10*3/uL (ref 0.0–0.1)
BASOS PCT: 0.8 % (ref 0.0–3.0)
EOS ABS: 0.2 10*3/uL (ref 0.0–0.7)
EOS PCT: 1.9 % (ref 0.0–5.0)
HCT: 43.7 % (ref 39.0–52.0)
HEMOGLOBIN: 14.8 g/dL (ref 13.0–17.0)
LYMPHS ABS: 1.9 10*3/uL (ref 0.7–4.0)
LYMPHS PCT: 21.9 % (ref 12.0–46.0)
MCHC: 33.9 g/dL (ref 30.0–36.0)
MCV: 91.1 fl (ref 78.0–100.0)
MONO ABS: 1 10*3/uL (ref 0.1–1.0)
Monocytes Relative: 11.8 % (ref 3.0–12.0)
NEUTROS ABS: 5.6 10*3/uL (ref 1.4–7.7)
Neutrophils Relative %: 63.6 % (ref 43.0–77.0)
Platelets: 270 10*3/uL (ref 150.0–400.0)
RBC: 4.79 Mil/uL (ref 4.22–5.81)
RDW: 13.4 % (ref 11.5–15.5)
WBC: 8.9 10*3/uL (ref 4.0–10.5)

## 2015-04-30 LAB — LIPID PANEL
CHOL/HDL RATIO: 3
CHOLESTEROL: 95 mg/dL (ref 0–200)
HDL: 37.8 mg/dL — ABNORMAL LOW (ref >39.00)
LDL Cholesterol: 38 mg/dL (ref 0–99)
NONHDL: 57.2
Triglycerides: 96 mg/dL (ref 0.0–149.0)
VLDL: 19.2 mg/dL (ref 0.0–40.0)

## 2015-04-30 LAB — BASIC METABOLIC PANEL
BUN: 16 mg/dL (ref 6–23)
CO2: 27 mEq/L (ref 19–32)
CREATININE: 1.14 mg/dL (ref 0.40–1.50)
Calcium: 9.5 mg/dL (ref 8.4–10.5)
Chloride: 105 mEq/L (ref 96–112)
GFR: 72.41 mL/min (ref >60.00)
Glucose, Bld: 110 mg/dL — ABNORMAL HIGH (ref 70–99)
POTASSIUM: 4.4 meq/L (ref 3.5–5.1)
Sodium: 137 mEq/L (ref 135–145)

## 2015-04-30 LAB — HEMOGLOBIN A1C: HEMOGLOBIN A1C: 5.8 % (ref 4.6–6.5)

## 2015-05-06 ENCOUNTER — Other Ambulatory Visit: Payer: Self-pay | Admitting: Cardiovascular Disease

## 2015-05-25 ENCOUNTER — Ambulatory Visit (INDEPENDENT_AMBULATORY_CARE_PROVIDER_SITE_OTHER): Payer: No Typology Code available for payment source | Admitting: Cardiovascular Disease

## 2015-05-25 ENCOUNTER — Encounter: Payer: Self-pay | Admitting: Cardiovascular Disease

## 2015-05-25 VITALS — BP 112/62 | HR 79 | Ht 72.0 in | Wt 258.0 lb

## 2015-05-25 DIAGNOSIS — E785 Hyperlipidemia, unspecified: Secondary | ICD-10-CM

## 2015-05-25 DIAGNOSIS — I10 Essential (primary) hypertension: Secondary | ICD-10-CM | POA: Diagnosis not present

## 2015-05-25 DIAGNOSIS — I2510 Atherosclerotic heart disease of native coronary artery without angina pectoris: Secondary | ICD-10-CM | POA: Diagnosis not present

## 2015-05-25 NOTE — Patient Instructions (Signed)
Medication Instructions:  Your physician recommends that you continue on your current medications as directed. Please refer to the Current Medication list given to you today.  Labwork: No new orders.   Testing/Procedures: No new orders.   Follow-Up: Your physician wants you to follow-up in: 1 YEAR wit Dr Burt Knack.  You will receive a reminder letter in the mail two months in advance. If you don't receive a letter, please call our office to schedule the follow-up appointment.   Any Other Special Instructions Will Be Listed Below (If Applicable).

## 2015-05-25 NOTE — Progress Notes (Signed)
Cardiology Office Note   Date:  05/25/2015   ID:  Norman Anderson, DOB 1966/04/23, MRN [id number]  PCP:  Annye Asa, MD  Cardiologist:  Sherren Mocha, MD    Chief Complaint  Patient presents with  . Coronary Artery Disease    History of Present Illness: Norman Anderson is a 49 y.o. male who presents for followup evaluation. 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 He was treated with PCI of the left circumflex and had nonobstructive CAD elsewhere.  He's doing well. No chest pain, shortness of breath, or edema. Feels well. He's active, was walking 4-5 miles per day but not consistent recently. He is compliant with his medications.   Past Medical History  Diagnosis Date  . Coronary atherosclerosis of native coronary artery March 2014    inferoposterior MI, DES - left circumflex  . HTN (hypertension)   . Hyperlipidemia     Past Surgical History  Procedure Laterality Date  . Coronary angioplasty with stent placement      Current Outpatient Prescriptions  Medication Sig Dispense Refill  . Ascorbic Acid (VITAMIN C PO) Take by mouth.    Marland Kitchen aspirin 81 MG tablet Take 81 mg by mouth daily.    . B Complex-C (SUPER B COMPLEX PO) Take by mouth.    . CRESTOR 20 MG tablet TAKE 1 TABLET BY MOUTH DAILY 90 tablet 0  . Flaxseed, Linseed, (FLAXSEED OIL PO) Take by mouth.    Marland Kitchen lisinopril (PRINIVIL,ZESTRIL) 20 MG tablet TAKE 1 TABLET (20 MG TOTAL) BY MOUTH DAILY. 90 tablet 1  . metoprolol (LOPRESSOR) 100 MG tablet TAKE 1 TABLET BY MOUTH TWICE A DAY 180 tablet 0  . Multiple Vitamin (MULTIVITAMIN) tablet Take 1 tablet by mouth daily. One tab    . Omega 3-6-9 Fatty Acids (OMEGA 3-6-9 COMPLEX PO) Take 1,400 tablets by mouth once.    . pantoprazole (PROTONIX) 40 MG tablet TAKE 1 TABLET (40 MG TOTAL) BY MOUTH DAILY. 90 tablet 1  . potassium gluconate (EQL POTASSIUM GLUCONATE) 595 MG TABS tablet Take 595 mg by mouth daily. ONE TAB DAILY    . WHEY PROTEIN PO  Take by mouth.     No current facility-administered medications for this visit.    Allergies:   Review of patient's allergies indicates no known allergies.   Social History:  The patient  reports that he quit smoking about 22 months ago. His smoking use included Cigarettes. He has never used smokeless tobacco. He reports that he drinks alcohol. He reports that he does not use illicit drugs.   Family History:  The patient's  family history includes Coronary artery disease (age of onset: 17) in his father; Heart attack in his father.   ROS:  Please see the history of present illness.  All other systems are reviewed and negative.   PHYSICAL EXAM: VS:  BP 112/62 mmHg  Pulse 79  Ht 6' (1.829 m)  Wt 258 lb (117.028 kg)  BMI 34.98 kg/m2 , BMI Body mass index is 34.98 kg/(m^2). GEN: Well nourished, well developed, in no acute distress HEENT: normal Neck: no JVD, no masses. No carotid bruits Cardiac: RRR without murmur or gallop                Respiratory:  clear to auscultation bilaterally, normal work of breathing GI: soft, nontender, nondistended, + BS MS: no deformity or atrophy Ext: no pretibial edema, pedal pulses 2+= bilaterally Skin: warm and dry, no rash, tan  from sun exposure Neuro:  Strength and sensation are intact Psych: euthymic mood, full affect  EKG:  EKG is ordered today. The ekg ordered today shows  Normal sinus rhythm 79 bpm, inferior infarct age undetermined.  Recent Labs: 04/29/2015: ALT 34; BUN 16; Creatinine, Ser 1.14; Hemoglobin 14.8; Platelets 270.0; Potassium 4.4; Sodium 137   Lipid Panel     Component Value Date/Time   CHOL 95 04/29/2015 1503   TRIG 96.0 04/29/2015 1503   HDL 37.80* 04/29/2015 1503   CHOLHDL 3 04/29/2015 1503   VLDL 19.2 04/29/2015 1503   LDLCALC 38 04/29/2015 1503      Wt Readings from Last 3 Encounters:  05/25/15 258 lb (117.028 kg)  04/29/15 255 lb 8 oz (115.894 kg)  05/22/14 240 lb 1.9 oz (108.918 kg)    ASSESSMENT AND  PLAN: 1.  CAD, native vessel:  Stable without symptoms of angina. He continues on a combination of aspirin, lisinopril, metoprolol, and Crestor. We discussed the importance of diet, exercise, and weight loss. I will see him back in one year.  2. Essential hypertension: Blood pressure is well controlled on lisinopril and metoprolol.  3. Hyperlipidemia: Lipids reviewed as above. We discussed lifestyle modification.  Current medicines are reviewed with the patient today.  The patient does not have concerns regarding medicines.  Labs/ tests ordered today include:   Orders Placed This Encounter  Procedures  . EKG 12-Lead   Disposition:   FU one year  Signed, Sherren Mocha, MD  05/25/2015 [facility] [place] [address], [place], [place]  [address] Phone: [phone]; Fax: [phone]

## 2015-07-30 ENCOUNTER — Other Ambulatory Visit: Payer: Self-pay | Admitting: Cardiovascular Disease

## 2015-08-06 ENCOUNTER — Encounter: Payer: Self-pay | Admitting: Family Medicine

## 2015-11-03 ENCOUNTER — Other Ambulatory Visit: Payer: Self-pay | Admitting: Family Medicine

## 2015-11-03 NOTE — Telephone Encounter (Signed)
Pt is in need of Physical.

## 2016-02-01 ENCOUNTER — Other Ambulatory Visit: Payer: Self-pay | Admitting: Family Medicine

## 2016-02-01 MED ORDER — PANTOPRAZOLE SODIUM 40 MG PO TBEC: DELAYED_RELEASE_TABLET | ORAL | Status: DC

## 2016-02-02 ENCOUNTER — Other Ambulatory Visit: Payer: Self-pay | Admitting: Family Medicine

## 2016-02-29 ENCOUNTER — Telehealth: Payer: Self-pay | Admitting: *Deleted

## 2016-02-29 MED ORDER — PANTOPRAZOLE SODIUM 40 MG PO TBEC: DELAYED_RELEASE_TABLET | ORAL | Status: DC

## 2016-02-29 NOTE — Telephone Encounter (Signed)
Med filled until pt sees Dr. Lorelei Pont.

## 2016-02-29 NOTE — Telephone Encounter (Signed)
eScribe request from [facility] for refill on Pantoprazole Last filled - 02/01/16, #30x0 Last AEX - 04/29/15  Medication Detail      Disp Refills Start End     pantoprazole (PROTONIX) 40 MG tablet 30 tablet 0 02/01/2016     Sig: TAKE 1 TABLET (40 MG TOTAL) BY MOUTH DAILY.    Notes to Pharmacy: Call [phone] to schedule you Physical, no further refills without an appt.    E-Prescribing Status: Receipt confirmed by pharmacy (02/01/2016 11:33 AM EST)     Next AEX - 03/14/16 [with Dr. Copland]/SLS 03/27

## 2016-03-11 ENCOUNTER — Encounter: Payer: Self-pay | Admitting: Behavioral Health

## 2016-03-11 ENCOUNTER — Telehealth: Payer: Self-pay | Admitting: Behavioral Health

## 2016-03-11 NOTE — Telephone Encounter (Signed)
Pre-Visit Call completed with patient and chart updated.   Pre-Visit Info documented in Specialty Comments under [employer].    

## 2016-03-14 ENCOUNTER — Encounter: Payer: Self-pay | Admitting: Gastroenterology

## 2016-03-14 ENCOUNTER — Ambulatory Visit (INDEPENDENT_AMBULATORY_CARE_PROVIDER_SITE_OTHER): Payer: No Typology Code available for payment source | Admitting: Family Medicine

## 2016-03-14 ENCOUNTER — Encounter: Payer: Self-pay | Admitting: Family Medicine

## 2016-03-14 VITALS — BP 98/60 | HR 64 | Temp 97.7°F | Ht 72.0 in | Wt 247.8 lb

## 2016-03-14 DIAGNOSIS — I252 Old myocardial infarction: Secondary | ICD-10-CM | POA: Diagnosis not present

## 2016-03-14 DIAGNOSIS — Z1211 Encounter for screening for malignant neoplasm of colon: Secondary | ICD-10-CM | POA: Diagnosis not present

## 2016-03-14 DIAGNOSIS — Z125 Encounter for screening for malignant neoplasm of prostate: Secondary | ICD-10-CM

## 2016-03-14 DIAGNOSIS — K219 Gastro-esophageal reflux disease without esophagitis: Secondary | ICD-10-CM

## 2016-03-14 DIAGNOSIS — I10 Essential (primary) hypertension: Secondary | ICD-10-CM

## 2016-03-14 DIAGNOSIS — Z130 Encounter for screening for diseases of the blood and blood-forming organs and certain disorders involving the immune mechanism: Secondary | ICD-10-CM

## 2016-03-14 DIAGNOSIS — Z13220 Encounter for screening for lipoid disorders: Secondary | ICD-10-CM

## 2016-03-14 DIAGNOSIS — Z131 Encounter for screening for diabetes mellitus: Secondary | ICD-10-CM | POA: Diagnosis not present

## 2016-03-14 LAB — COMPREHENSIVE METABOLIC PANEL
ALK PHOS: 63 U/L (ref 39–117)
ALT: 46 U/L (ref 0–53)
AST: 27 U/L (ref 0–37)
Albumin: 4.6 g/dL (ref 3.5–5.2)
BILIRUBIN TOTAL: 0.7 mg/dL (ref 0.2–1.2)
BUN: 20 mg/dL (ref 6–23)
CALCIUM: 9.8 mg/dL (ref 8.4–10.5)
CO2: 24 meq/L (ref 19–32)
CREATININE: 1.18 mg/dL (ref 0.40–1.50)
Chloride: 104 mEq/L (ref 96–112)
GFR: 69.34 mL/min (ref >60.00)
Glucose, Bld: 117 mg/dL — ABNORMAL HIGH (ref 70–99)
Potassium: 4.8 mEq/L (ref 3.5–5.1)
Sodium: 136 mEq/L (ref 135–145)
TOTAL PROTEIN: 7.3 g/dL (ref 6.0–8.3)

## 2016-03-14 LAB — LIPID PANEL
CHOL/HDL RATIO: 3
Cholesterol: 115 mg/dL (ref 0–200)
HDL: 38.2 mg/dL — AB (ref >39.00)
LDL Cholesterol: 54 mg/dL (ref 0–99)
NONHDL: 76.59
TRIGLYCERIDES: 113 mg/dL (ref 0.0–149.0)
VLDL: 22.6 mg/dL (ref 0.0–40.0)

## 2016-03-14 LAB — HEMOGLOBIN A1C: Hgb A1c MFr Bld: 6.2 % (ref 4.6–6.5)

## 2016-03-14 LAB — CBC
HCT: 44.9 % (ref 39.0–52.0)
HEMOGLOBIN: 15.2 g/dL (ref 13.0–17.0)
MCHC: 33.8 g/dL (ref 30.0–36.0)
MCV: 90.2 fl (ref 78.0–100.0)
PLATELETS: 289 10*3/uL (ref 150.0–400.0)
RBC: 4.97 Mil/uL (ref 4.22–5.81)
RDW: 12.5 % (ref 11.5–15.5)
WBC: 8.2 10*3/uL (ref 4.0–10.5)

## 2016-03-14 LAB — PSA: PSA: 0.94 ng/mL (ref 0.10–4.00)

## 2016-03-14 MED ORDER — PANTOPRAZOLE SODIUM 40 MG PO TBEC: DELAYED_RELEASE_TABLET | ORAL | Status: DC

## 2016-03-14 NOTE — Progress Notes (Signed)
Pre visit review using our clinic review tool, if applicable. No additional management support is needed unless otherwise documented below in the visit note. 

## 2016-03-14 NOTE — Patient Instructions (Signed)
Great job with your weight loss! I will be in touch with your labs asap Since your BP is running low, please splint your lisinopril pills in half and take 10 mg only If after a week your BP continues to be lower than 110/80 please let me know and we may have you stop this entirely I will get you set up for a colonoscopy

## 2016-03-14 NOTE — Progress Notes (Signed)
[place] at [facility] [address], [place], [place] [address] [phone]  Date:  03/14/2016   Name:  Norman Anderson   DOB:  July 11, 1966   MRN:  [id number]  PCP:  Lamar Blinks, MD    Chief Complaint: Establish Care   History of Present Illness:  Esaias Kromer is a 50 y.o. very pleasant male patient who presents with the following:  Here today to establish care and discuss his baseline issues of HTN, hyperlipidemia, Jerrye Bushy.  MI in 2014 treated with PCI to the left circumflex and medical management, he sees Dr. Burt Knack annually   He is fasting today for labs.  He has not noted any SE of his medications. He does notice that his BP is running low but does not have any lightheadedness or other symptoms of hypotension He has been on protonix for a year or so. He has tired not taking this but his GERD sx will return   He is working on losing weight.  He and his wife have changed their diet.   He is really pleased with his progress He is walking AND running some for exercise.  No chest pain symptoms or other concerns with exercise   He is on baby aspirin, lisinopril, lopressor He is due for a colonoscopy and would like a referral for same today  Wt Readings from Last 3 Encounters:  03/14/16 247 lb 12.8 oz (112.401 kg)  05/25/15 258 lb (117.028 kg)  04/29/15 255 lb 8 oz (115.894 kg)   BP Readings from Last 3 Encounters:  03/14/16 98/60  05/25/15 112/62  04/29/15 122/80     Patient Active Problem List   Diagnosis Date Noted  . Obesity (BMI 30.0-34.9) 12/13/2013  . GERD (gastroesophageal reflux disease) 12/13/2013  . Esophageal dysphagia 12/13/2013  . Coronary atherosclerosis of native coronary artery 09/27/2013  . Hyperlipidemia 09/27/2013  . Essential hypertension 09/27/2013    Past Medical History  Diagnosis Date  . Coronary atherosclerosis of native coronary artery March 2014    inferoposterior MI, DES - left  circumflex  . HTN (hypertension)   . Hyperlipidemia     Past Surgical History  Procedure Laterality Date  . Coronary angioplasty with stent placement      Social History  Substance Use Topics  . Smoking status: Former Smoker    Types: Cigarettes    Quit date: 07/05/2013  . Smokeless tobacco: Never Used  . Alcohol Use: Yes    Family History  Problem Relation Age of Onset  . Coronary artery disease Father 64    died of MI  . Heart attack Father     No Known Allergies  Medication list has been reviewed and updated.  Current Outpatient Prescriptions on File Prior to Visit  Medication Sig Dispense Refill  . Ascorbic Acid (VITAMIN C PO) Take by mouth.    Marland Kitchen aspirin 81 MG tablet Take 81 mg by mouth daily.    . B Complex-C (SUPER B COMPLEX PO) Take by mouth.    Marland Kitchen lisinopril (PRINIVIL,ZESTRIL) 20 MG tablet TAKE 1 TABLET (20 MG TOTAL) BY MOUTH DAILY. 90 tablet 2  . metoprolol (LOPRESSOR) 100 MG tablet TAKE 1 TABLET BY MOUTH TWICE A DAY 180 tablet 0  . pantoprazole (PROTONIX) 40 MG tablet TAKE 1 TABLET (40 MG TOTAL) BY MOUTH DAILY. 30 tablet 0  . WHEY PROTEIN PO Take by mouth. Reported on 03/11/2016     No current facility-administered medications on file  prior to visit.    Review of Systems:  As per HPI- otherwise negative.   Physical Examination: Filed Vitals:   03/14/16 0931  BP: 98/60  Pulse: 64  Temp: 97.7 F (36.5 C)   Filed Vitals:   03/14/16 0931  Height: 6' (1.829 m)  Weight: 247 lb 12.8 oz (112.401 kg)   Body mass index is 33.6 kg/(m^2). Ideal Body Weight: Weight in (lb) to have BMI = 25: 183.9  GEN: WDWN, NAD, Non-toxic, A & O x 3, overweight/ muscular build. Here today with his wife HEENT: Atraumatic, Normocephalic. Neck supple. No masses, No LAD. Ears and Nose: No external deformity. CV: RRR, No M/G/R. No JVD. No thrill. No extra heart sounds. PULM: CTA B, no wheezes, crackles, rhonchi. No retractions. No resp. distress. No accessory muscle  use. EXTR: No c/c/e NEURO Normal gait.  PSYCH: Normally interactive. Conversant. Not depressed or anxious appearing.  Calm demeanor.  Looks well   Assessment and Plan: Essential hypertension  Encounter for screening colonoscopy - Plan: Ambulatory referral to Gastroenterology  Gastroesophageal reflux disease, esophagitis presence not specified - Plan: pantoprazole (PROTONIX) 40 MG tablet  History of MI (myocardial infarction)  Screening for hyperlipidemia - Plan: Lipid panel  Screening for diabetes mellitus - Plan: Comprehensive metabolic panel, Hemoglobin A1c  Screening for deficiency anemia - Plan: CBC  Screening for prostate cancer - Plan: PSA  His BP is running low- as he has lost weight and is exercising more will need to decrease his medication.  Will have him split his lisinopril in half and keep me posted regarding his home BP readings.  If he continues to run low will stop this entirely.  PSA and referral to GI for colonoscopy as he is now 51 yo Screening labs as above Refilled his protonix Will plan further follow- up pending labs.   Signed Lamar Blinks, MD

## 2016-03-15 ENCOUNTER — Encounter: Payer: Self-pay | Admitting: Family Medicine

## 2016-03-15 DIAGNOSIS — R7303 Prediabetes: Secondary | ICD-10-CM | POA: Insufficient documentation

## 2016-04-25 ENCOUNTER — Ambulatory Visit (AMBULATORY_SURGERY_CENTER): Payer: Self-pay

## 2016-04-25 VITALS — Ht 71.0 in | Wt 245.0 lb

## 2016-04-25 DIAGNOSIS — Z1211 Encounter for screening for malignant neoplasm of colon: Secondary | ICD-10-CM

## 2016-04-25 MED ORDER — SUPREP BOWEL PREP KIT 17.5-3.13-1.6 GM/177ML PO SOLN: 1.0000 | Freq: Once | ORAL | Status: DC

## 2016-04-25 NOTE — Progress Notes (Signed)
No allergies to eggs or soy No past problems with anesthesia No diet meds No home oxygen  Has email and internet; registered emmi

## 2016-04-28 ENCOUNTER — Other Ambulatory Visit: Payer: Self-pay | Admitting: Cardiovascular Disease

## 2016-04-29 ENCOUNTER — Encounter: Payer: Self-pay | Admitting: Gastroenterology

## 2016-05-09 ENCOUNTER — Encounter: Payer: Self-pay | Admitting: Gastroenterology

## 2016-05-09 ENCOUNTER — Ambulatory Visit (AMBULATORY_SURGERY_CENTER): Payer: No Typology Code available for payment source | Admitting: Gastroenterology

## 2016-05-09 VITALS — BP 111/71 | HR 51 | Temp 98.4°F | Resp 12 | Ht 71.0 in | Wt 245.0 lb

## 2016-05-09 DIAGNOSIS — D125 Benign neoplasm of sigmoid colon: Secondary | ICD-10-CM

## 2016-05-09 DIAGNOSIS — D122 Benign neoplasm of ascending colon: Secondary | ICD-10-CM | POA: Diagnosis not present

## 2016-05-09 DIAGNOSIS — D123 Benign neoplasm of transverse colon: Secondary | ICD-10-CM | POA: Diagnosis not present

## 2016-05-09 DIAGNOSIS — Z1211 Encounter for screening for malignant neoplasm of colon: Secondary | ICD-10-CM

## 2016-05-09 MED ORDER — SODIUM CHLORIDE 0.9 % IV SOLN: 500.0000 mL | INTRAVENOUS | Status: DC

## 2016-05-09 NOTE — Patient Instructions (Signed)
YOU HAD AN ENDOSCOPIC PROCEDURE TODAY AT [facility]:   Refer to the procedure report that was given to you for any specific questions about what was found during the examination.  If the procedure report does not answer your questions, please call your gastroenterologist to clarify.  If you requested that your care partner not be given the details of your procedure findings, then the procedure report has been included in a sealed envelope for you to review at your convenience later.  YOU SHOULD EXPECT: Some feelings of bloating in the abdomen. Passage of more gas than usual.  Walking can help get rid of the air that was put into your GI tract during the procedure and reduce the bloating. If you had a lower endoscopy (such as a colonoscopy or flexible sigmoidoscopy) you may notice spotting of blood in your stool or on the toilet paper. If you underwent a bowel prep for your procedure, you may not have a normal bowel movement for a few days.  Please Note:  You might notice some irritation and congestion in your nose or some drainage.  This is from the oxygen used during your procedure.  There is no need for concern and it should clear up in a day or so.  SYMPTOMS TO REPORT IMMEDIATELY:   Following lower endoscopy (colonoscopy or flexible sigmoidoscopy):  Excessive amounts of blood in the stool  Significant tenderness or worsening of abdominal pains  Swelling of the abdomen that is new, acute  Fever of 100F or higher   For urgent or emergent issues, a gastroenterologist can be reached at any hour by calling [phone].   DIET: Your first meal following the procedure should be a small meal and then it is ok to progress to your normal diet. Heavy or fried foods are harder to digest and may make you feel nauseous or bloated.  Likewise, meals heavy in dairy and vegetables can increase bloating.  Drink plenty of fluids but you should avoid alcoholic beverages for 24  hours.  ACTIVITY:  You should plan to take it easy for the rest of today and you should NOT DRIVE or use heavy machinery until tomorrow (because of the sedation medicines used during the test).    FOLLOW UP: Our staff will call the number listed on your records the next business day following your procedure to check on you and address any questions or concerns that you may have regarding the information given to you following your procedure. If we do not reach you, we will leave a message.  However, if you are feeling well and you are not experiencing any problems, there is no need to return our call.  We will assume that you have returned to your regular daily activities without incident.  If any biopsies were taken you will be contacted by phone or by letter within the next 1-3 weeks.  Please call us at [phone] if you have not heard about the biopsies in 3 weeks.    SIGNATURES/CONFIDENTIALITY: You and/or your care partner have signed paperwork which will be entered into your electronic medical record.  These signatures attest to the fact that that the information above on your After Visit Summary has been reviewed and is understood.  Full responsibility of the confidentiality of this discharge information lies with you and/or your care-partner.    Handouts were given to your care partner on polyps, diverticulosis, hemorrhoids, and a high fiber diet with liberal fluid intake. No aspirin,  aspirin products,  ibuprofen, naproxen, advil, motrin, aleve, or other non-steroidal anti-inflammatory drugs for 14 days after polyp removal.  However, per Dr. Havery Moros you may continue taking aspirin 81 mg. You may resume your other current medications today. Await biopsy results. Consideration for general surgery consult to remove anal canal polyp / hypertrophied ana papillae. Please call if any questions or concerns.

## 2016-05-09 NOTE — Progress Notes (Signed)
Called to room to assist during endoscopic procedure.  Patient ID and intended procedure confirmed with present staff. Received instructions for my participation in the procedure from the performing physician.  

## 2016-05-09 NOTE — Op Note (Signed)
[name] Procedure Date: 05/09/2016 12:35 PM MRN: [id number] Endoscopist: Remo Lipps P. Havery Moros , MD Age: 50 Referring MD:  Date of Birth: 08/05/1966 Gender: Male Procedure:                Colonoscopy Indications:              Screening for malignant neoplasm in the colon, This                            is the patient's first colonoscopy Medicines:                Monitored Anesthesia Care Procedure:                Pre-Anesthesia Assessment:                           - Prior to the procedure, a History and Physical                            was performed, and patient medications and                            allergies were reviewed. The patient's tolerance of                            previous anesthesia was also reviewed. The risks                            and benefits of the procedure and the sedation                            options and risks were discussed with the patient.                            All questions were answered, and informed consent                            was obtained. Prior Anticoagulants: The patient has                            taken aspirin, last dose was 1 day prior to                            procedure. ASA Grade Assessment: III - A patient                            with severe systemic disease. After reviewing the                            risks and benefits, the patient was deemed in                            satisfactory condition to undergo the procedure.  After obtaining informed consent, the colonoscope                            was passed under direct vision. Throughout the                            procedure, the patient's blood pressure, pulse, and                            oxygen saturations were monitored continuously. The                            Model CF-HQ190L [id number]) scope was introduced                            through the anus and advanced to the  the cecum,                            identified by appendiceal orifice and ileocecal                            valve. The colonoscopy was performed without                            difficulty. The patient tolerated the procedure                            well. The quality of the bowel preparation was                            good. The ileocecal valve, appendiceal orifice, and                            rectum were photographed. Scope In: 1:28:32 PM Scope Out: 1:49:41 PM Scope Withdrawal Time: 0 hours 18 minutes 27 seconds  Total Procedure Duration: 0 hours 21 minutes 9 seconds  Findings:                 The perianal and digital rectal examinations were                            normal.                           A 5 mm polyp was found in the ascending colon. The                            polyp was sessile. The polyp was removed with a                            cold snare. Resection and retrieval were complete.                           A 6 mm polyp was found in the transverse colon. The  polyp was sessile. The polyp was removed with a                            cold snare. Resection and retrieval were complete.                           Three sessile polyps were found in the sigmoid                            colon. The polyps were 3 to 6 mm in size. These                            polyps were removed with a cold snare. Resection                            and retrieval were complete.                           Multiple medium-mouthed diverticula were found in                            the transverse colon and left colon.                           A 6 mm polyp vs. hypertrophied anal papillae was                            found in the anus. The polyp was pedunculated.                           Non-bleeding internal hemorrhoids were found during                            retroflexion.                           The exam was otherwise without  abnormality. Complications:            No immediate complications. Estimated blood loss:                            Minimal. Estimated Blood Loss:     Estimated blood loss was minimal. Impression:               - One 5 mm polyp in the ascending colon, removed                            with a cold snare. Resected and retrieved.                           - One 6 mm polyp in the transverse colon, removed                            with a cold snare. Resected and retrieved.                           -  Three 3 to 6 mm polyps in the sigmoid colon,                            removed with a cold snare. Resected and retrieved.                           - Diverticulosis in the transverse colon and in the                            left colon.                           - One 6 mm polyp vs. hypertrophied anal papilla at                            the anus which was not removed.                           - Non-bleeding internal hemorrhoids.                           - The examination was otherwise normal. Recommendation:           - Patient has a contact number available for                            emergencies. The signs and symptoms of potential                            delayed complications were discussed with the                            patient. Return to normal activities tomorrow.                            Written discharge instructions were provided to the                            patient.                           - Resume previous diet.                           - Continue present medications.                           - No ibuprofen, naproxen, or other non-steroidal                            anti-inflammatory drugs for 2 weeks after polyp                            removal.                           -  Await pathology results.                           - Repeat colonoscopy is recommended for                            surveillance. The colonoscopy date will be                             determined after pathology results from today's                            exam become available for review.                           - Consideration for general surgery consult to                            remove anal canal polyp / hypertrophied anal                            papillae Remo Lipps P. Armbruster, MD 05/09/2016 1:56:53 PM This report has been signed electronically.

## 2016-05-09 NOTE — Progress Notes (Signed)
No problems noted in the recovery room. Maw  I explained to the pt and his wife that, Lafe Garin, CRNA accidentally scratched the pt right jaw approximately half inch scratch.  Pt's wife saw the scratch mark.  I asked the pt if he would like to see it and he said no, I am ok. maw

## 2016-05-09 NOTE — Progress Notes (Signed)
Report given to PACU RN, vss Small cut noted on right cheek below jaw due to jaw thrust and my finger nail.  Staff updated.

## 2016-05-10 ENCOUNTER — Telehealth: Payer: Self-pay

## 2016-05-10 NOTE — Telephone Encounter (Signed)
  Follow up Call-  Call back number 05/09/2016  Post procedure Call Back phone  # [phone]  Permission to leave phone message Yes     Patient questions:  Do you have a fever, pain , or abdominal swelling? No. Pain Score  0 *  Have you tolerated food without any problems? Yes.    Have you been able to return to your normal activities? Yes.    Do you have any questions about your discharge instructions: Diet   No. Medications  No. Follow up visit  No.  Do you have questions or concerns about your Care? No.  Actions: * If pain score is 4 or above: No action needed, pain <4.

## 2016-05-13 ENCOUNTER — Telehealth: Payer: Self-pay | Admitting: *Deleted

## 2016-05-13 NOTE — Telephone Encounter (Signed)
Received a call from [place] that patient's insurance in not in network. He will have to pay $166.60 for visit. They will file insurance for him.

## 2016-05-13 NOTE — Telephone Encounter (Signed)
Patient notified and he will call his insurance and see what his out of network benefit will be. He will call back.

## 2016-05-17 ENCOUNTER — Telehealth: Payer: Self-pay | Admitting: Gastroenterology

## 2016-05-17 NOTE — Telephone Encounter (Signed)
Patient wife states that she found that Malachi Carl or Alcide Evener [phone] are in Network. Best # to reach her at is [phone]. Patient states that she would like our office to call Dr. Dois Davenport office to cancel that appt.

## 2016-05-17 NOTE — Telephone Encounter (Signed)
Number provided is incorrect. Called [facility] at [phone] and scheduled patient with Dr. Quentin Cornwall on 06/09/16 at 3:15 PM. They will mail paper work to patient. He will need to be sure he does not need authorization for visit. Faxed records to [phone]. Spoke with patient's wife and gave her appointment.

## 2016-05-17 NOTE — Telephone Encounter (Signed)
Spoke with patient's wife and she states the [facility] said the surgeons he can see are Melvia Heaps in [place] and [facility] in [place]. Unable to locate a Melvia Heaps in [place] and Dr. Melven Sartorius is an orthopedic surgeon. She will call them back and ask for a general surgeon.

## 2016-05-31 ENCOUNTER — Other Ambulatory Visit: Payer: Self-pay | Admitting: Cardiovascular Disease

## 2016-06-03 ENCOUNTER — Other Ambulatory Visit: Payer: Self-pay | Admitting: Cardiovascular Disease

## 2016-08-15 ENCOUNTER — Ambulatory Visit (INDEPENDENT_AMBULATORY_CARE_PROVIDER_SITE_OTHER): Payer: No Typology Code available for payment source | Admitting: Family Medicine

## 2016-08-15 ENCOUNTER — Encounter: Payer: Self-pay | Admitting: Family Medicine

## 2016-08-15 VITALS — BP 122/86 | HR 69 | Temp 97.7°F | Ht 71.0 in | Wt 248.8 lb

## 2016-08-15 DIAGNOSIS — M7712 Lateral epicondylitis, left elbow: Secondary | ICD-10-CM | POA: Diagnosis not present

## 2016-08-15 DIAGNOSIS — G5622 Lesion of ulnar nerve, left upper limb: Secondary | ICD-10-CM | POA: Diagnosis not present

## 2016-08-15 MED ORDER — PREDNISONE 20 MG PO TABS: ORAL_TABLET | ORAL | 0 refills | Status: DC

## 2016-08-15 NOTE — Progress Notes (Signed)
[place] at [facility] [address] [place], [place] [address] [phone] [id number] [phone]  Date:  08/15/2016   Name:  Norman Anderson   DOB:  May 27, 1966   MRN:  [id number]  PCP:  Lamar Blinks, MD    Chief Complaint: No chief complaint on file.   History of Present Illness:  Norman Anderson is a 50 y.o. very pleasant male patient who presents with the following:  Here today with an injury. Last seen by myself in April of this year. At that time his labs looked good except for pre-diabetes.   He lost his balance (while leaving back in a chair) and fell backwards out of a chair about 6-8 weeks ago.  He fell onto a cement patio onto the left elbow and shoulder.  He is not quite sure of the position of his arm and shoulder when he fell.  He thought he was ok, but then got concerned about symptoms as below No head injury.   No neck pain.   He noted onset of pain in the LEFT axilla and numbness in the LEFT long, ring and small fingers about a week after the injury. He also feels like the arm is weak He feels like there is a "deep, nerve pain" down the arm No cough, SOB, fever, night sweats, or CP  Wt Readings from Last 3 Encounters:  08/15/16 248 lb 12.8 oz (112.9 kg)  05/09/16 245 lb (111.1 kg)  04/25/16 245 lb (111.1 kg)   No weight loss He has mostly just tried heat so far for the pain.   No other neurological sx such as slurred speech or other weakness, numbness  Patient Active Problem List   Diagnosis Date Noted  . Pre-diabetes 03/15/2016  . Obesity (BMI 30.0-34.9) 12/13/2013  . GERD (gastroesophageal reflux disease) 12/13/2013  . Esophageal dysphagia 12/13/2013  . Coronary atherosclerosis of native coronary artery 09/27/2013  . Hyperlipidemia 09/27/2013  . Essential hypertension 09/27/2013    Past Medical History:  Diagnosis Date  . Coronary atherosclerosis of native coronary artery March 2014   inferoposterior MI, DES - left  circumflex  . HTN (hypertension)   . Hyperlipidemia     Past Surgical History:  Procedure Laterality Date  . CORONARY ANGIOPLASTY WITH STENT PLACEMENT  02/02/2013    Social History  Substance Use Topics  . Smoking status: Former Smoker    Types: Cigarettes    Quit date: 07/05/2013  . Smokeless tobacco: Never Used  . Alcohol use 1.2 oz/week    2 Cans of beer per week     Comment: mixture    Family History  Problem Relation Age of Onset  . Coronary artery disease Father 74    died of MI  . Heart attack Father   . Colon cancer Neg Hx     No Known Allergies  Medication list has been reviewed and updated.  Current Outpatient Prescriptions on File Prior to Visit  Medication Sig Dispense Refill  . aspirin 81 MG tablet Take 81 mg by mouth daily.    . metoprolol (LOPRESSOR) 100 MG tablet TAKE 1 TABLET BY MOUTH TWICE A DAY 180 tablet 0  . metoprolol (LOPRESSOR) 100 MG tablet TAKE 1 TABLET BY MOUTH TWICE A DAY 180 tablet 0  . pantoprazole (PROTONIX) 40 MG tablet TAKE 1 TABLET (40 MG TOTAL) BY MOUTH DAILY. 90 tablet 2  . rosuvastatin (CRESTOR) 20 MG tablet TAKE 1 TABLET BY MOUTH DAILY 90 tablet 0  .  lisinopril (PRINIVIL,ZESTRIL) 10 MG tablet Take 10 mg by mouth daily.     No current facility-administered medications on file prior to visit.     Review of Systems:  As per HPI- otherwise negative.   Physical Examination: Vitals:   08/15/16 1348  BP: 122/86  Pulse: 69  Temp: 97.7 F (36.5 C)   Vitals:   08/15/16 1348  Weight: 248 lb 12.8 oz (112.9 kg)  Height: 5\' 11"  (1.803 m)   Body mass index is 34.7 kg/m. Ideal Body Weight: Weight in (lb) to have BMI = 25: 178.9  GEN: WDWN, NAD, Non-toxic, A & O x 3, muscular build. overweight HEENT: Atraumatic, Normocephalic. Neck supple. No masses, No LAD.  Bilateral TM wnl, oropharynx normal.  PEERL,EOMI.   No neck tenderness, normal cervical ROM, negative Spurling's test Ears and Nose: No external deformity. CV: RRR, No  M/G/R. No JVD. No thrill. No extra heart sounds. PULM: CTA B, no wheezes, crackles, rhonchi. No retractions. No resp. distress. No accessory muscle use. EXTR: No c/c/e NEURO Normal gait.  PSYCH: Normally interactive. Conversant. Not depressed or anxious appearing.  Calm demeanor.  Left arm: decreased grip strength.  Normal biceps, triceps strength and wrist flexion/ extension.  He notes that he feels pain coursing from his left axilla down into the arm along the course of the ulnar nerve Pain with resisted supination, less so with resisted pronation Tenderness over the lateral epicondyle c/w tennis elbow. Normal sensation of both arms except he notes slightly reduced touch sensation over the left ring and small fingers Assessment and Plan: Lateral epicondylitis, left - Plan: predniSONE (DELTASONE) 20 MG tablet, Ambulatory referral to Orthopedic Surgery  Lesion of left ulnar nerve - Plan: predniSONE (DELTASONE) 20 MG tablet, Ambulatory referral to Orthopedic Surgery  Here today with exam consistent with lat epicondylitis, but also possible sx of an ulnar or other nerve problem with numbness of the ulnar side fingers of the left hand. May have a brachial plexus injury from fall.  Will refer to ortho for eval, trial of prednisone in the meantime.  He will avoid strenuous use of this left arm and will let me know if any change or worsening of his symptoms  Signed Lamar Blinks, MD

## 2016-08-15 NOTE — Patient Instructions (Signed)
It was very nice to see you today It seems to me that you may have an element of lateral epicondylitis (tennis elbow) and also a problem with your ulnar nerve; you may have stretched this nerve to excess when you fell I am going to refer you to orthopedics to get their opinion.  In the meantime we will try 10 days of prednisone for you- let me know if you have any bothersome side effects or worsening of your symptoms Avoid NSAID medications while you are on the prednisone

## 2016-09-01 ENCOUNTER — Other Ambulatory Visit: Payer: Self-pay | Admitting: Cardiovascular Disease

## 2016-10-24 ENCOUNTER — Ambulatory Visit: Payer: No Typology Code available for payment source | Admitting: Cardiovascular Disease

## 2016-11-21 ENCOUNTER — Other Ambulatory Visit: Payer: Self-pay | Admitting: Family Medicine

## 2016-11-21 DIAGNOSIS — K219 Gastro-esophageal reflux disease without esophagitis: Secondary | ICD-10-CM

## 2016-12-03 ENCOUNTER — Other Ambulatory Visit: Payer: Self-pay | Admitting: Cardiovascular Disease

## 2017-03-07 ENCOUNTER — Other Ambulatory Visit: Payer: Self-pay | Admitting: Cardiovascular Disease

## 2017-04-17 ENCOUNTER — Ambulatory Visit (INDEPENDENT_AMBULATORY_CARE_PROVIDER_SITE_OTHER): Payer: No Typology Code available for payment source | Admitting: Family Medicine

## 2017-04-17 ENCOUNTER — Encounter: Payer: Self-pay | Admitting: Family Medicine

## 2017-04-17 VITALS — BP 118/70 | HR 60 | Temp 97.6°F | Ht 71.5 in | Wt 246.4 lb

## 2017-04-17 DIAGNOSIS — E875 Hyperkalemia: Secondary | ICD-10-CM

## 2017-04-17 DIAGNOSIS — Z0000 Encounter for general adult medical examination without abnormal findings: Secondary | ICD-10-CM | POA: Diagnosis not present

## 2017-04-17 DIAGNOSIS — Z131 Encounter for screening for diabetes mellitus: Secondary | ICD-10-CM

## 2017-04-17 DIAGNOSIS — K219 Gastro-esophageal reflux disease without esophagitis: Secondary | ICD-10-CM | POA: Diagnosis not present

## 2017-04-17 DIAGNOSIS — I2510 Atherosclerotic heart disease of native coronary artery without angina pectoris: Secondary | ICD-10-CM | POA: Diagnosis not present

## 2017-04-17 DIAGNOSIS — Z125 Encounter for screening for malignant neoplasm of prostate: Secondary | ICD-10-CM

## 2017-04-17 DIAGNOSIS — E785 Hyperlipidemia, unspecified: Secondary | ICD-10-CM | POA: Diagnosis not present

## 2017-04-17 DIAGNOSIS — Z5181 Encounter for therapeutic drug level monitoring: Secondary | ICD-10-CM | POA: Diagnosis not present

## 2017-04-17 LAB — CBC
HEMATOCRIT: 45.7 % (ref 39.0–52.0)
HEMOGLOBIN: 15.5 g/dL (ref 13.0–17.0)
MCHC: 34 g/dL (ref 30.0–36.0)
MCV: 89.3 fl (ref 78.0–100.0)
Platelets: 290 10*3/uL (ref 150.0–400.0)
RBC: 5.12 Mil/uL (ref 4.22–5.81)
RDW: 12.7 % (ref 11.5–15.5)
WBC: 9.5 10*3/uL (ref 4.0–10.5)

## 2017-04-17 LAB — HEMOGLOBIN A1C: Hgb A1c MFr Bld: 6.2 % (ref 4.6–6.5)

## 2017-04-17 LAB — LIPID PANEL
Cholesterol: 125 mg/dL (ref 0–200)
HDL: 44.5 mg/dL (ref >39.00)
LDL Cholesterol: 53 mg/dL (ref 0–99)
NonHDL: 80.69
Total CHOL/HDL Ratio: 3
Triglycerides: 136 mg/dL (ref 0.0–149.0)
VLDL: 27.2 mg/dL (ref 0.0–40.0)

## 2017-04-17 LAB — COMPREHENSIVE METABOLIC PANEL
ALK PHOS: 71 U/L (ref 39–117)
ALT: 31 U/L (ref 0–53)
AST: 26 U/L (ref 0–37)
Albumin: 4.5 g/dL (ref 3.5–5.2)
BUN: 19 mg/dL (ref 6–23)
CALCIUM: 9.7 mg/dL (ref 8.4–10.5)
CO2: 29 mEq/L (ref 19–32)
Chloride: 103 mEq/L (ref 96–112)
Creatinine, Ser: 1.17 mg/dL (ref 0.40–1.50)
GFR: 69.72 mL/min (ref >60.00)
Glucose, Bld: 120 mg/dL — ABNORMAL HIGH (ref 70–99)
Potassium: 5.3 mEq/L — ABNORMAL HIGH (ref 3.5–5.1)
Sodium: 138 mEq/L (ref 135–145)
TOTAL PROTEIN: 7.1 g/dL (ref 6.0–8.3)
Total Bilirubin: 0.7 mg/dL (ref 0.2–1.2)

## 2017-04-17 LAB — PSA: PSA: 0.62 ng/mL (ref 0.10–4.00)

## 2017-04-17 MED ORDER — PANTOPRAZOLE SODIUM 40 MG PO TBEC: DELAYED_RELEASE_TABLET | ORAL | 3 refills | Status: DC

## 2017-04-17 MED ORDER — ROSUVASTATIN CALCIUM 20 MG PO TABS: 20.0000 mg | ORAL_TABLET | Freq: Every day | ORAL | 3 refills | Status: DC

## 2017-04-17 MED ORDER — METOPROLOL TARTRATE 100 MG PO TABS: ORAL_TABLET | ORAL | 3 refills | Status: DC

## 2017-04-17 NOTE — Progress Notes (Addendum)
[place] at [facility] [address], [place], [place] [address] [phone]  Date:  04/17/2017   Name:  Norman Anderson   DOB:  1965/12/20   MRN:  [id number]  PCP:  Darreld Mclean, MD    Chief Complaint: Annual Exam (Pt here for CPE. )   History of Present Illness:  Norman Anderson is a 51 y.o. very pleasant male patient who presents with the following:  Last seen by myself in the fall with lateral epicondylitis.   The elbow still bothers him some with exercise. He did have a steroid injection per orthopedics which helped for a little bit History of obesity, CAD with stent in 2014, HTN, hyperlipidemia, pre-diabetes.   Last complete labs a year ago He is on lopressor, protonix, baby aspirin and crestor He did have cereal this am  BP Readings from Last 3 Encounters:  04/17/17 118/70  08/15/16 122/86  05/09/16 111/71    He has been exercising more since February of this year.  He has lost about 18 lbs Sometimes while or after exercising he will have left shoulder pain. He thinks this is MSK pain- history of prior shoulder injury with pain- but was not sure if it might be something else.    He does see cardiology on a regular basis.  Will see Dr. Burt Knack in about 2 weeks  He has a hypopigmented macule on his right thumb- it statted with a scrape  He tried to come off his protonix but he needs it - too many sx when he tried to use nothing or sub an H2 blocker instead  Wt Readings from Last 3 Encounters:  04/17/17 246 lb 6.4 oz (111.8 kg)  08/15/16 248 lb 12.8 oz (112.9 kg)  05/09/16 245 lb (111.1 kg)    Patient Active Problem List   Diagnosis Date Noted  . Pre-diabetes 03/15/2016  . Obesity (BMI 30.0-34.9) 12/13/2013  . GERD (gastroesophageal reflux disease) 12/13/2013  . Esophageal dysphagia 12/13/2013  . Coronary atherosclerosis of native coronary artery 09/27/2013  . Hyperlipidemia 09/27/2013  . Essential  hypertension 09/27/2013    Past Medical History:  Diagnosis Date  . Coronary atherosclerosis of native coronary artery March 2014   inferoposterior MI, DES - left circumflex  . HTN (hypertension)   . Hyperlipidemia     Past Surgical History:  Procedure Laterality Date  . CORONARY ANGIOPLASTY WITH STENT PLACEMENT  02/02/2013    Social History  Substance Use Topics  . Smoking status: Former Smoker    Types: Cigarettes    Quit date: 07/05/2013  . Smokeless tobacco: Never Used  . Alcohol use 1.2 oz/week    2 Cans of beer per week     Comment: mixture    Family History  Problem Relation Age of Onset  . Coronary artery disease Father 31       died of MI  . Heart attack Father   . Colon cancer Neg Hx     No Known Allergies  Medication list has been reviewed and updated.  Current Outpatient Prescriptions on File Prior to Visit  Medication Sig Dispense Refill  . aspirin 81 MG tablet Take 81 mg by mouth daily.    . metoprolol (LOPRESSOR) 100 MG tablet TAKE 1 TABLET BY MOUTH TWICE A DAY (NEED APPT) 180 tablet 0  . pantoprazole (PROTONIX) 40 MG tablet TAKE 1 TABLET (40 MG TOTAL) BY MOUTH DAILY. 90 tablet 2  . rosuvastatin (CRESTOR)  20 MG tablet TAKE 1 TABLET BY MOUTH DAILY 90 tablet 0   No current facility-administered medications on file prior to visit.     Review of Systems:  As per HPI- otherwise negative. No issues with urination or his bowels No concerning moles   Physical Examination: Vitals:   04/17/17 1324  BP: 118/70  Pulse: 60  Temp: 97.6 F (36.4 C)   Vitals:   04/17/17 1324  Weight: 246 lb 6.4 oz (111.8 kg)  Height: 5' 11.5" (1.816 m)   Body mass index is 33.89 kg/m. Ideal Body Weight: Weight in (lb) to have BMI = 25: 181.4  GEN: WDWN, NAD, Non-toxic, A & O x 3, large/ muscular build, looks well Here today with his wife HEENT: Atraumatic, Normocephalic. Neck supple. No masses, No LAD.  Bilateral TM wnl, oropharynx normal.  PEERL,EOMI.   Ears and  Nose: No external deformity. CV: RRR, No M/G/R. No JVD. No thrill. No extra heart sounds. PULM: CTA B, no wheezes, crackles, rhonchi. No retractions. No resp. distress. No accessory muscle use. ABD: S, NT, ND EXTR: No c/c/e NEURO Normal gait.  PSYCH: Normally interactive. Conversant. Not depressed or anxious appearing.  Calm demeanor.    Assessment and Plan: Physical exam  Gastroesophageal reflux disease, esophagitis presence not specified - Plan: pantoprazole (PROTONIX) 40 MG tablet  Hyperlipidemia, unspecified hyperlipidemia type - Plan: rosuvastatin (CRESTOR) 20 MG tablet, Lipid panel  Coronary artery disease involving native coronary artery of native heart without angina pectoris - Plan: rosuvastatin (CRESTOR) 20 MG tablet, metoprolol tartrate (LOPRESSOR) 100 MG tablet  Screening for prostate cancer - Plan: PSA  Screening for diabetes mellitus - Plan: Hemoglobin A1c  Medication monitoring encounter - Plan: CBC, Comprehensive metabolic panel  Here today for a CPE Labs pending as above, refilled his medications Colonoscopy is UTD Encouraged continued healthy lifestyle He will be sure to mention his shoulder pain to Dr. Burt Knack- he may need a stress test to evaluate his heart again.  No imaging since his MI 4 years ago. If any change or worsening in the meantime he will seek care   Signed Lamar Blinks, MD Received his labs Psa is better than last year  Results for orders placed or performed in visit on 04/17/17  CBC  Result Value Ref Range   WBC 9.5 4.0 - 10.5 K/uL   RBC 5.12 4.22 - 5.81 Mil/uL   Platelets 290.0 150.0 - 400.0 K/uL   Hemoglobin 15.5 13.0 - 17.0 g/dL   HCT 45.7 39.0 - 52.0 %   MCV 89.3 78.0 - 100.0 fl   MCHC 34.0 30.0 - 36.0 g/dL   RDW 12.7 11.5 - 15.5 %  Comprehensive metabolic panel  Result Value Ref Range   Sodium 138 135 - 145 mEq/L   Potassium 5.3 (H) 3.5 - 5.1 mEq/L   Chloride 103 96 - 112 mEq/L   CO2 29 19 - 32 mEq/L   Glucose, Bld 120 (H)  70 - 99 mg/dL   BUN 19 6 - 23 mg/dL   Creatinine, Ser 1.17 0.40 - 1.50 mg/dL   Total Bilirubin 0.7 0.2 - 1.2 mg/dL   Alkaline Phosphatase 71 39 - 117 U/L   AST 26 0 - 37 U/L   ALT 31 0 - 53 U/L   Total Protein 7.1 6.0 - 8.3 g/dL   Albumin 4.5 3.5 - 5.2 g/dL   Calcium 9.7 8.4 - 10.5 mg/dL   GFR 69.72 >60.00 mL/min  Lipid panel  Result Value Ref Range  Cholesterol 125 0 - 200 mg/dL   Triglycerides 136.0 0.0 - 149.0 mg/dL   HDL 44.50 >39.00 mg/dL   VLDL 27.2 0.0 - 40.0 mg/dL   LDL Cholesterol 53 0 - 99 mg/dL   Total CHOL/HDL Ratio 3    NonHDL 80.69   Hemoglobin A1c  Result Value Ref Range   Hgb A1c MFr Bld 6.2 4.6 - 6.5 %  PSA  Result Value Ref Range   PSA 0.62 0.10 - 4.00 ng/mL   Your labs are overall very good.   Your PSA is lower than last year, which is very reassuring that you do not have prostate cancer Your A1c (average blood sugar) is still in the pre-diabetes range.  Keep exercising and eating right! Your cholesterol looks great- continue crestor Blood count is normal My only small concern is that your potassium is slightly high.  This is likely not "real" and probably resulted from some trauma to the blood cells during processing which causes them to release excess potassium.  However, we need to monitor this because high potassium (higher than your level) can cause serious heart arrhythmias.  If you would, please come in for a potassium check (blood draw only) in 1-2 weeks so we can make sure this goes back to normal.  Otherwise we can plan to visit for a physical in a year.

## 2017-04-17 NOTE — Patient Instructions (Addendum)
It was a pleasure to see you today!  Take care and I will be in touch with your labs asap Continue your good exercise habits and be careful of the sun   Health Maintenance, Male A healthy lifestyle and preventive care is important for your health and wellness. Ask your health care provider about what schedule of regular examinations is right for you. What should I know about weight and diet?  Eat a Healthy Diet  Eat plenty of vegetables, fruits, whole grains, low-fat dairy products, and lean protein.  Do not eat a lot of foods high in solid fats, added sugars, or salt. Maintain a Healthy Weight  Regular exercise can help you achieve or maintain a healthy weight. You should:  Do at least 150 minutes of exercise each week. The exercise should increase your heart rate and make you sweat (moderate-intensity exercise).  Do strength-training exercises at least twice a week. Watch Your Levels of Cholesterol and Blood Lipids  Have your blood tested for lipids and cholesterol every 5 years starting at 53 years of age. If you are at high risk for heart disease, you should start having your blood tested when you are 52 years old. You may need to have your cholesterol levels checked more often if:  Your lipid or cholesterol levels are high.  You are older than 52 years of age.  You are at high risk for heart disease. What should I know about cancer screening? Many types of cancers can be detected early and may often be prevented. Lung Cancer  You should be screened every year for lung cancer if:  You are a current smoker who has smoked for at least 30 years.  You are a former smoker who has quit within the past 15 years.  Talk to your health care provider about your screening options, when you should start screening, and how often you should be screened. Colorectal Cancer  Routine colorectal cancer screening usually begins at 52 years of age and should be repeated every 5-10 years until  you are 52 years old. You may need to be screened more often if early forms of precancerous polyps or small growths are found. Your health care provider may recommend screening at an earlier age if you have risk factors for colon cancer.  Your health care provider may recommend using home test kits to check for hidden blood in the stool.  A small camera at the end of a tube can be used to examine your colon (sigmoidoscopy or colonoscopy). This checks for the earliest forms of colorectal cancer. Prostate and Testicular Cancer  Depending on your age and overall health, your health care provider may do certain tests to screen for prostate and testicular cancer.  Talk to your health care provider about any symptoms or concerns you have about testicular or prostate cancer. Skin Cancer  Check your skin from head to toe regularly.  Tell your health care provider about any new moles or changes in moles, especially if:  There is a change in a mole's size, shape, or color.  You have a mole that is larger than a pencil eraser.  Always use sunscreen. Apply sunscreen liberally and repeat throughout the day.  Protect yourself by wearing long sleeves, pants, a wide-brimmed hat, and sunglasses when outside. What should I know about heart disease, diabetes, and high blood pressure?  If you are 61-72 years of age, have your blood pressure checked every 3-5 years. If you are 40 years of  age or older, have your blood pressure checked every year. You should have your blood pressure measured twice-once when you are at a hospital or clinic, and once when you are not at a hospital or clinic. Record the average of the two measurements. To check your blood pressure when you are not at a hospital or clinic, you can use:  An automated blood pressure machine at a pharmacy.  A home blood pressure monitor.  Talk to your health care provider about your target blood pressure.  If you are between 23-74 years old, ask  your health care provider if you should take aspirin to prevent heart disease.  Have regular diabetes screenings by checking your fasting blood sugar level.  If you are at a normal weight and have a low risk for diabetes, have this test once every three years after the age of 35.  If you are overweight and have a high risk for diabetes, consider being tested at a younger age or more often.  A one-time screening for abdominal aortic aneurysm (AAA) by ultrasound is recommended for men aged 42-75 years who are current or former smokers. What should I know about preventing infection? Hepatitis B  If you have a higher risk for hepatitis B, you should be screened for this virus. Talk with your health care provider to find out if you are at risk for hepatitis B infection. Hepatitis C  Blood testing is recommended for:  Everyone born from [birth year] through 1966.  Anyone with known risk factors for hepatitis C. Sexually Transmitted Diseases (STDs)  You should be screened each year for STDs including gonorrhea and chlamydia if:  You are sexually active and are younger than 52 years of age.  You are older than 52 years of age and your health care provider tells you that you are at risk for this type of infection.  Your sexual activity has changed since you were last screened and you are at an increased risk for chlamydia or gonorrhea. Ask your health care provider if you are at risk.  Talk with your health care provider about whether you are at high risk of being infected with HIV. Your health care provider may recommend a prescription medicine to help prevent HIV infection. What else can I do?  Schedule regular health, dental, and eye exams.  Stay current with your vaccines (immunizations).  Do not use any tobacco products, such as cigarettes, chewing tobacco, and e-cigarettes. If you need help quitting, ask your health care provider.  Limit alcohol intake to no more than 2 drinks per day. One  drink equals 12 ounces of beer, 5 ounces of wine, or 1 ounces of hard liquor.  Do not use street drugs.  Do not share needles.  Ask your health care provider for help if you need support or information about quitting drugs.  Tell your health care provider if you often feel depressed.  Tell your health care provider if you have ever been abused or do not feel safe at home. This information is not intended to replace advice given to you by your health care provider. Make sure you discuss any questions you have with your health care provider. Document Released: 05/19/2008 Document Revised: 07/20/2016 Document Reviewed: 08/25/2015 Elsevier Interactive Patient Education  2017 [employer].

## 2017-04-17 NOTE — Addendum Note (Signed)
Addended by: Lamar Blinks C on: 04/17/2017 06:07 PM   Modules accepted: Orders

## 2017-05-04 ENCOUNTER — Encounter: Payer: Self-pay | Admitting: Cardiovascular Disease

## 2017-05-04 ENCOUNTER — Ambulatory Visit (INDEPENDENT_AMBULATORY_CARE_PROVIDER_SITE_OTHER): Payer: No Typology Code available for payment source | Admitting: Cardiovascular Disease

## 2017-05-04 VITALS — BP 104/78 | HR 54 | Ht 71.0 in | Wt 246.1 lb

## 2017-05-04 DIAGNOSIS — E785 Hyperlipidemia, unspecified: Secondary | ICD-10-CM | POA: Diagnosis not present

## 2017-05-04 DIAGNOSIS — I10 Essential (primary) hypertension: Secondary | ICD-10-CM | POA: Diagnosis not present

## 2017-05-04 DIAGNOSIS — I2510 Atherosclerotic heart disease of native coronary artery without angina pectoris: Secondary | ICD-10-CM | POA: Diagnosis not present

## 2017-05-04 NOTE — Progress Notes (Signed)
Cardiology Office Note Date:  05/04/2017   ID:  Norman Anderson, DOB 1966-06-11, MRN [id number]  PCP:  Darreld Mclean, MD  Cardiologist:  Sherren Mocha, MD    Chief Complaint  Patient presents with  . Coronary Artery Disease     History of Present Illness: Norman Anderson is a 51 y.o. male who presents for  followup evaluation. He has CAD and initially presented with an inferoposterior MI in 2014. He was treated with PCI of the left circumflex and had nonobstructive CAD elsewhere.  He's here with his wife today. Doing well - they exercise regularly without exertional symptoms. He's lost 12# since his last visit here. Today, he denies symptoms of palpitations, chest pain, shortness of breath, orthopnea, PND, lower extremity edema, dizziness, or syncope.   Past Medical History:  Diagnosis Date  . Coronary atherosclerosis of native coronary artery March 2014   inferoposterior MI, DES - left circumflex  . HTN (hypertension)   . Hyperlipidemia     Past Surgical History:  Procedure Laterality Date  . CORONARY ANGIOPLASTY WITH STENT PLACEMENT  02/02/2013    Current Outpatient Prescriptions  Medication Sig Dispense Refill  . aspirin 81 MG tablet Take 81 mg by mouth daily.    . metoprolol tartrate (LOPRESSOR) 100 MG tablet TAKE 1 TABLET BY MOUTH TWICE A DAY (NEED APPT) 180 tablet 3  . Multiple Vitamin (MULTIVITAMIN) capsule Take 1 capsule by mouth daily.    . pantoprazole (PROTONIX) 40 MG tablet TAKE 1 TABLET (40 MG TOTAL) BY MOUTH DAILY. 90 tablet 3  . rosuvastatin (CRESTOR) 20 MG tablet Take 1 tablet (20 mg total) by mouth daily. 90 tablet 3   No current facility-administered medications for this visit.     Allergies:   Patient has no known allergies.   Social History:  The patient  reports that he has been smoking Cigarettes.  He has been smoking about 0.50 packs per day. He has never used smokeless tobacco. He reports that he drinks about 1.2 oz of alcohol per week . He  reports that he does not use drugs.   Family History:  The patient's  family history includes Coronary artery disease (age of onset: 2) in his father; Heart attack in his father.    ROS:  Please see the history of present illness.  All other systems are reviewed and negative.    PHYSICAL EXAM: VS:  BP 104/78   Pulse (!) 54   Ht 5\' 11"  (1.803 m)   Wt 246 lb 1.9 oz (111.6 kg)   SpO2 96%   BMI 34.33 kg/m  , BMI Body mass index is 34.33 kg/m. GEN: Well nourished, well developed, in no acute distress  HEENT: normal  Neck: no JVD, no masses. No carotid bruits Cardiac: RRR without murmur or gallop                Respiratory:  clear to auscultation bilaterally, normal work of breathing GI: soft, nontender, nondistended, + BS MS: no deformity or atrophy  Ext: no pretibial edema, pedal pulses 2+= bilaterally Skin: warm and dry, no rash Neuro:  Strength and sensation are intact Psych: euthymic mood, full affect  EKG:  EKG is ordered today. The ekg ordered today shows sinus brady with age-indeterminate inferoposterior infarct, HR 54 bpm  Recent Labs: 04/17/2017: ALT 31; BUN 19; Creatinine, Ser 1.17; Hemoglobin 15.5; Platelets 290.0; Potassium 5.3; Sodium 138   Lipid Panel     Component Value Date/Time   CHOL 125 04/17/2017  1352   TRIG 136.0 04/17/2017 1352   HDL 44.50 04/17/2017 1352   CHOLHDL 3 04/17/2017 1352   VLDL 27.2 04/17/2017 1352   LDLCALC 53 04/17/2017 1352      Wt Readings from Last 3 Encounters:  05/04/17 246 lb 1.9 oz (111.6 kg)  04/17/17 246 lb 6.4 oz (111.8 kg)  08/15/16 248 lb 12.8 oz (112.9 kg)    ASSESSMENT AND PLAN: 1.  CAD, native vessel: no angina. Medications reviewed and appropriate. No changes.   2. Hyperlipidemia: lipids at goal - see recent labs above. Continue statin drug. LIfestyle modification discussed with the patient.   3. HTN: BP well-controlled on current Rx. No changes. Recent labs reviewed.   Current medicines are reviewed with the  patient today.  The patient does not have concerns regarding medicines.  Labs/ tests ordered today include:   Orders Placed This Encounter  Procedures  . EKG 12-Lead    Disposition:   FU one year  Signed, Sherren Mocha, MD  05/04/2017 5:27 PM    [facility] [place], [place], [place]  [address] Phone: [phone]; Fax: [phone]

## 2017-05-04 NOTE — Patient Instructions (Signed)

## 2017-06-14 ENCOUNTER — Other Ambulatory Visit: Payer: Self-pay | Admitting: Emergency Medicine

## 2017-06-14 ENCOUNTER — Telehealth: Payer: Self-pay | Admitting: Family Medicine

## 2017-06-14 DIAGNOSIS — I2510 Atherosclerotic heart disease of native coronary artery without angina pectoris: Secondary | ICD-10-CM

## 2017-06-14 MED ORDER — METOPROLOL TARTRATE 100 MG PO TABS: ORAL_TABLET | ORAL | 3 refills | Status: DC

## 2017-06-14 NOTE — Telephone Encounter (Signed)
Refill sent per pt request.  

## 2017-06-14 NOTE — Telephone Encounter (Signed)
Relation to QH:QIXM Call back number:[phone] Pharmacy [id number] - [place], [place] [address] [phone] (Phone) [phone] (Fax)     Reason for call:  Patient requesting a refill 3 month supply metoprolol tartrate (LOPRESSOR) 100 MG tablet

## 2017-08-25 ENCOUNTER — Telehealth: Payer: Self-pay | Admitting: Medical

## 2017-08-25 ENCOUNTER — Ambulatory Visit: Payer: No Typology Code available for payment source

## 2017-08-25 ENCOUNTER — Encounter: Payer: Self-pay | Admitting: Medical

## 2017-08-25 NOTE — Telephone Encounter (Signed)
Pt had a tdap in 2015. He is due for one in 2025. Can we make a copy of health maintenance of immunizations she he can show whoever is telling him he needs vacccine?

## 2017-08-25 NOTE — Telephone Encounter (Signed)
Patient notified. Offered copy of report states that was ok. Mailed copy.

## 2017-08-25 NOTE — Telephone Encounter (Signed)
Pt grandchild was just born in [place]. pt wants to travel to see the baby but they cannot see baby unless they have DTAP. Please Mackie Pai will you put the order in for him to come this afternoon at 3:00 to get the shot. [facility] is off today. Please forward to [facility] or Santiago Glad your response per their request.

## 2017-08-25 NOTE — Telephone Encounter (Signed)
Patient notified. Report mailed. Appointment cancelled.

## 2018-06-22 ENCOUNTER — Other Ambulatory Visit: Payer: Self-pay | Admitting: Family Medicine

## 2018-06-22 DIAGNOSIS — K219 Gastro-esophageal reflux disease without esophagitis: Secondary | ICD-10-CM

## 2018-07-10 NOTE — Progress Notes (Addendum)
[facility] [place], [place], [place] [address] [phone]  Date:  07/11/2018   Name:  Norman Anderson   DOB:  12-08-1965   MRN:  [id number]  PCP:  Darreld Mclean, MD    Chief Complaint: Medication Refill (lab work, no concerns)   History of Present Illness:  Norman Anderson is a 52 y.o. very pleasant male patient who presents with the following:  Here today to discuss his medications- he was overdue for a visit so he came in to be seen History of obesity, pre-diabetes, hyperlipidemia, HTN, MI/CAD with stent placed 2014   Last seen by myself over a year ago: He is on lopressor, protonix, baby aspirin and crestor     BP Readings from Last 3 Encounters:  04/17/17 118/70  08/15/16 122/86  05/09/16 111/71   He has been exercising more since February of this year.  He has lost about 18 lbs Sometimes while or after exercising he will have left shoulder pain. He thinks this is MSK pain- history of prior shoulder injury with pain- but was not sure if it might be something else.   He does see cardiology on a regular basis.  Will see Dr. Burt Knack in about 2 weeks   Most recent cardiology note from May of 2018:    ASSESSMENT AND PLAN: 1.  CAD, native vessel: no angina. Medications reviewed and appropriate. No changes.  2. Hyperlipidemia: lipids at goal - see recent labs above. Continue statin drug. LIfestyle modification discussed with the patient.  3. HTN: BP well-controlled on current Rx. No changes. Recent labs reviewed.   Labs are over a year old  He had just a few bites of food so far today -will plan to check labs  BP Readings from Last 3 Encounters:  07/11/18 (!) 146/86  05/04/17 104/78  04/17/17 118/70   He is taking protonix every day- he tried not taking it but his GERD sx returned   Wt Readings from Last 3 Encounters:  07/11/18 255 lb (115.7 kg)  05/04/17 246 lb 1.9 oz (111.6 kg)  04/17/17 246  lb 6.4 oz (111.8 kg)   Was down to 244 at his lowest weight- has gained back a few pounds but will continue to work on this   He notes that his home BP at home is running "normal to low" but they have not checked it in a bit, their machine broke He has noted some pain in his left ear/ neck for about 2 weeks. He thought it might have started with a bug flying in his ear No CP or SOB noted.  He is not exercising much formally but does not notice any CP with routine exertion  Upon further questioning the pain is from his left TM joint into the left neck.  It is not acute but has been intermittent for the last 2 weeks or so No tooth pain noted   No fever or chills No ST He thought maybe the pain was coming from his left ear but is not quite sure   Current smoker Here today with his wife who contributes to the history   Patient Active Problem List   Diagnosis Date Noted  . Pre-diabetes 03/15/2016  . Obesity (BMI 30.0-34.9) 12/13/2013  . GERD (gastroesophageal reflux disease) 12/13/2013  . Esophageal dysphagia 12/13/2013  . Coronary atherosclerosis of native coronary artery 09/27/2013  . Hyperlipidemia 09/27/2013  . Essential hypertension 09/27/2013  Past Medical History:  Diagnosis Date  . Coronary atherosclerosis of native coronary artery March 2014   inferoposterior MI, DES - left circumflex  . HTN (hypertension)   . Hyperlipidemia     Past Surgical History:  Procedure Laterality Date  . CORONARY ANGIOPLASTY WITH STENT PLACEMENT  02/02/2013    Social History   Tobacco Use  . Smoking status: Current Every Day Smoker    Packs/day: 0.50    Types: Cigarettes  . Smokeless tobacco: Never Used  . Tobacco comment: Started smoking again recently (05/04/17)  Substance Use Topics  . Alcohol use: Yes    Alcohol/week: 1.2 oz    Types: 2 Cans of beer per week    Comment: mixture  . Drug use: No    Family History  Problem Relation Age of Onset  . Coronary artery disease  Father 49       died of MI  . Heart attack Father   . Colon cancer Neg Hx     No Known Allergies  Medication list has been reviewed and updated.  Current Outpatient Medications on File Prior to Visit  Medication Sig Dispense Refill  . aspirin 81 MG tablet Take 81 mg by mouth daily.    . Multiple Vitamin (MULTIVITAMIN) capsule Take 1 capsule by mouth daily.     No current facility-administered medications on file prior to visit.     Review of Systems:  As per HPI- otherwise negative.  Physical Examination: Vitals:   07/11/18 1341  BP: (!) 146/86  Pulse: (!) 52  Resp: 18  Temp: 97.6 F (36.4 C)  SpO2: 98%   Vitals:   07/11/18 1341  Weight: 255 lb (115.7 kg)  Height: 5\' 11"  (1.803 m)   Body mass index is 35.57 kg/m. Ideal Body Weight: Weight in (lb) to have BMI = 25: 178.9  GEN: WDWN, NAD, Non-toxic, A & O x 3, obese, otherwise looks well  HEENT: Atraumatic, Normocephalic. Neck supple. No masses, No LAD.  Bilateral TM wnl, oropharynx normal.  PEERL,EOMI.   I can perhaps reproduce his neck pain with pressure on the left TM joint.  However pt is not 503% certain this is the source of his pain Teeth are not tender to pressure Neck is not tender to pressure  Ears and Nose: No external deformity. CV: RRR, No M/G/R. No JVD. No thrill. No extra heart sounds. PULM: CTA B, no wheezes, crackles, rhonchi. No retractions. No resp. distress. No accessory muscle use. ABD: S, NT, ND. No rebound. No HSM. EXTR: No c/c/e NEURO Normal gait.  PSYCH: Normally interactive. Conversant. Not depressed or anxious appearing.  Calm demeanor.   EKG: SR with mild bradycardia, rate 55. no acute change c/w 2018 Assessment and Plan: Gastroesophageal reflux disease, esophagitis presence not specified - Plan: pantoprazole (PROTONIX) 40 MG tablet, DISCONTINUED: pantoprazole (PROTONIX) 40 MG tablet  Hyperlipidemia, unspecified hyperlipidemia type - Plan: Lipid panel, rosuvastatin (CRESTOR) 20 MG  tablet  Coronary artery disease involving native coronary artery of native heart without angina pectoris - Plan: Hemoglobin A1c, Lipid panel, metoprolol tartrate (LOPRESSOR) 100 MG tablet, rosuvastatin (CRESTOR) 20 MG tablet  Screening for prostate cancer - Plan: PSA  Screening for diabetes mellitus - Plan: Comprehensive metabolic panel, Hemoglobin A1c  Screening for deficiency anemia - Plan: CBC  Neck pain on left side - Plan: EKG 12-Lead, US Carotid Duplex Bilateral, Troponin I  History of MI/ CAD, hyperlipidemia, smoking again. Here today to get refills and labs.  He also has complaint  of left neck/ jaw, ear pain for 2 weeks. This is non acute and he denies any CP or SOB EKG today is reassuring  Will obtain carotid US Decided to get a troponin today for further reassurance although I expect it to be negative He plans to contact his cardiologist and set up a follow-up appt asap Will plan further follow- up pending labs. See patient instructions for more details.     Called pt with negative troponin at 5pm  Results for orders placed or performed in visit on 07/11/18  Troponin I  Result Value Ref Range   TNIDX 0.03 0.00 - 0.06 ug/l    Signed Lamar Blinks, MD  Received his labs 07/12/18 Results for orders placed or performed in visit on 07/11/18  CBC  Result Value Ref Range   WBC 8.1 4.0 - 10.5 K/uL   RBC 4.90 4.22 - 5.81 Mil/uL   Platelets 232.0 150.0 - 400.0 K/uL   Hemoglobin 15.1 13.0 - 17.0 g/dL   HCT 43.4 39.0 - 52.0 %   MCV 88.6 78.0 - 100.0 fl   MCHC 34.9 30.0 - 36.0 g/dL   RDW 13.0 11.5 - 15.5 %  Comprehensive metabolic panel  Result Value Ref Range   Sodium 136 135 - 145 mEq/L   Potassium 4.4 3.5 - 5.1 mEq/L   Chloride 102 96 - 112 mEq/L   CO2 25 19 - 32 mEq/L   Glucose, Bld 101 (H) 70 - 99 mg/dL   BUN 11 6 - 23 mg/dL   Creatinine, Ser 1.04 0.40 - 1.50 mg/dL   Total Bilirubin 0.7 0.2 - 1.2 mg/dL   Alkaline Phosphatase 64 39 - 117 U/L   AST 37 0 - 37 U/L    ALT 43 0 - 53 U/L   Total Protein 7.2 6.0 - 8.3 g/dL   Albumin 4.4 3.5 - 5.2 g/dL   Calcium 9.6 8.4 - 10.5 mg/dL   GFR 79.49 >60.00 mL/min  Hemoglobin A1c  Result Value Ref Range   Hgb A1c MFr Bld 6.4 4.6 - 6.5 %  Lipid panel  Result Value Ref Range   Cholesterol 137 0 - 200 mg/dL   Triglycerides 175.0 (H) 0.0 - 149.0 mg/dL   HDL 47.80 >39.00 mg/dL   VLDL 35.0 0.0 - 40.0 mg/dL   LDL Cholesterol 54 0 - 99 mg/dL   Total CHOL/HDL Ratio 3    NonHDL 89.37   PSA  Result Value Ref Range   PSA 0.65 0.10 - 4.00 ng/mL  Troponin I  Result Value Ref Range   TNIDX 0.03 0.00 - 0.06 ug/l   A1c last year was 6.2   Blood counts are normal Metabolic profile looks fine Your A1c (average blood sugar) is in the pre-diabetes range, like last year.  However it has gone up a bit and is close to the diabetes cut off (6.5%). Please do work on weight loss, and let's plan to repeat your A1c in 6 months Cholesterol is overall good! Lab Results      Component                Value               Date                      PSA                      0.65  07/11/2018                PSA                      0.62                04/17/2017                PSA                      0.94                03/14/2016           Your PSA is stable and looks fine.

## 2018-07-11 ENCOUNTER — Ambulatory Visit: Payer: No Typology Code available for payment source | Admitting: Family Medicine

## 2018-07-11 ENCOUNTER — Encounter: Payer: Self-pay | Admitting: Family Medicine

## 2018-07-11 VITALS — BP 146/86 | HR 52 | Temp 97.6°F | Resp 18 | Ht 71.0 in | Wt 255.0 lb

## 2018-07-11 DIAGNOSIS — M542 Cervicalgia: Secondary | ICD-10-CM

## 2018-07-11 DIAGNOSIS — I2510 Atherosclerotic heart disease of native coronary artery without angina pectoris: Secondary | ICD-10-CM | POA: Diagnosis not present

## 2018-07-11 DIAGNOSIS — E785 Hyperlipidemia, unspecified: Secondary | ICD-10-CM | POA: Diagnosis not present

## 2018-07-11 DIAGNOSIS — Z131 Encounter for screening for diabetes mellitus: Secondary | ICD-10-CM

## 2018-07-11 DIAGNOSIS — Z125 Encounter for screening for malignant neoplasm of prostate: Secondary | ICD-10-CM | POA: Diagnosis not present

## 2018-07-11 DIAGNOSIS — Z130 Encounter for screening for diseases of the blood and blood-forming organs and certain disorders involving the immune mechanism: Secondary | ICD-10-CM | POA: Diagnosis not present

## 2018-07-11 DIAGNOSIS — K219 Gastro-esophageal reflux disease without esophagitis: Secondary | ICD-10-CM | POA: Diagnosis not present

## 2018-07-11 LAB — TROPONIN I: TNIDX: 0.03 ug/l (ref 0.00–0.06)

## 2018-07-11 MED ORDER — PANTOPRAZOLE SODIUM 40 MG PO TBEC: DELAYED_RELEASE_TABLET | ORAL | 3 refills | Status: DC

## 2018-07-11 MED ORDER — ROSUVASTATIN CALCIUM 20 MG PO TABS: 20.0000 mg | ORAL_TABLET | Freq: Every day | ORAL | 3 refills | Status: DC

## 2018-07-11 MED ORDER — METOPROLOL TARTRATE 100 MG PO TABS: ORAL_TABLET | ORAL | 3 refills | Status: DC

## 2018-07-11 NOTE — Patient Instructions (Signed)
It was good to see you today - we will get your labs and I will set you up for an ultrasound of your neck  Please do schedule a visit with your cardiologist soon Please monitor your BP at home or at the drug store and keep a record.  If you are generally running higher than 135/85 we may need to adjust your medication a bit  If you develop any chest pain or shortness of breath please seek immediate care

## 2018-07-12 ENCOUNTER — Encounter: Payer: Self-pay | Admitting: Family Medicine

## 2018-07-12 LAB — LIPID PANEL
CHOL/HDL RATIO: 3
CHOLESTEROL: 137 mg/dL (ref 0–200)
HDL: 47.8 mg/dL (ref >39.00)
LDL Cholesterol: 54 mg/dL (ref 0–99)
NonHDL: 89.37
TRIGLYCERIDES: 175 mg/dL — AB (ref 0.0–149.0)
VLDL: 35 mg/dL (ref 0.0–40.0)

## 2018-07-12 LAB — CBC
HCT: 43.4 % (ref 39.0–52.0)
Hemoglobin: 15.1 g/dL (ref 13.0–17.0)
MCHC: 34.9 g/dL (ref 30.0–36.0)
MCV: 88.6 fl (ref 78.0–100.0)
Platelets: 232 10*3/uL (ref 150.0–400.0)
RBC: 4.9 Mil/uL (ref 4.22–5.81)
RDW: 13 % (ref 11.5–15.5)
WBC: 8.1 10*3/uL (ref 4.0–10.5)

## 2018-07-12 LAB — COMPREHENSIVE METABOLIC PANEL
ALK PHOS: 64 U/L (ref 39–117)
ALT: 43 U/L (ref 0–53)
AST: 37 U/L (ref 0–37)
Albumin: 4.4 g/dL (ref 3.5–5.2)
BILIRUBIN TOTAL: 0.7 mg/dL (ref 0.2–1.2)
BUN: 11 mg/dL (ref 6–23)
CO2: 25 mEq/L (ref 19–32)
Calcium: 9.6 mg/dL (ref 8.4–10.5)
Chloride: 102 mEq/L (ref 96–112)
Creatinine, Ser: 1.04 mg/dL (ref 0.40–1.50)
GFR: 79.49 mL/min (ref >60.00)
GLUCOSE: 101 mg/dL — AB (ref 70–99)
Potassium: 4.4 mEq/L (ref 3.5–5.1)
SODIUM: 136 meq/L (ref 135–145)
TOTAL PROTEIN: 7.2 g/dL (ref 6.0–8.3)

## 2018-07-12 LAB — HEMOGLOBIN A1C: Hgb A1c MFr Bld: 6.4 % (ref 4.6–6.5)

## 2018-07-12 LAB — PSA: PSA: 0.65 ng/mL (ref 0.10–4.00)

## 2018-07-18 ENCOUNTER — Ambulatory Visit (HOSPITAL_BASED_OUTPATIENT_CLINIC_OR_DEPARTMENT_OTHER)
Admission: RE | Admit: 2018-07-18 | Discharge: 2018-07-18 | Disposition: A | Discharge status: Home / Self Care | Payer: No Typology Code available for payment source | Source: Ambulatory Visit | Attending: Family Medicine | Admitting: Family Medicine

## 2018-07-18 DIAGNOSIS — I10 Essential (primary) hypertension: Secondary | ICD-10-CM | POA: Insufficient documentation

## 2018-07-18 DIAGNOSIS — M542 Cervicalgia: Secondary | ICD-10-CM | POA: Diagnosis present

## 2018-07-19 ENCOUNTER — Encounter: Payer: Self-pay | Admitting: Family Medicine

## 2019-01-30 ENCOUNTER — Other Ambulatory Visit: Payer: Self-pay

## 2019-01-30 DIAGNOSIS — E785 Hyperlipidemia, unspecified: Secondary | ICD-10-CM

## 2019-01-30 DIAGNOSIS — K219 Gastro-esophageal reflux disease without esophagitis: Secondary | ICD-10-CM

## 2019-01-30 DIAGNOSIS — I2510 Atherosclerotic heart disease of native coronary artery without angina pectoris: Secondary | ICD-10-CM

## 2019-01-30 MED ORDER — PANTOPRAZOLE SODIUM 40 MG PO TBEC: DELAYED_RELEASE_TABLET | ORAL | 1 refills | Status: DC

## 2019-01-30 MED ORDER — ROSUVASTATIN CALCIUM 20 MG PO TABS: 20.0000 mg | ORAL_TABLET | Freq: Every day | ORAL | 1 refills | Status: DC

## 2019-01-30 MED ORDER — METOPROLOL TARTRATE 100 MG PO TABS: ORAL_TABLET | ORAL | 1 refills | Status: DC

## 2019-05-23 ENCOUNTER — Encounter: Payer: Self-pay | Admitting: Gastroenterology

## 2019-07-30 ENCOUNTER — Other Ambulatory Visit: Payer: Self-pay | Admitting: Family Medicine

## 2019-07-30 DIAGNOSIS — K219 Gastro-esophageal reflux disease without esophagitis: Secondary | ICD-10-CM

## 2019-09-23 ENCOUNTER — Other Ambulatory Visit: Payer: Self-pay | Admitting: Family Medicine

## 2019-09-23 DIAGNOSIS — K219 Gastro-esophageal reflux disease without esophagitis: Secondary | ICD-10-CM

## 2019-09-26 ENCOUNTER — Other Ambulatory Visit: Payer: Self-pay | Admitting: Family Medicine

## 2020-02-26 ENCOUNTER — Encounter: Payer: Self-pay | Admitting: General Practice
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
## 2020-05-02 NOTE — Patient Instructions (Addendum)
It was good to see you again today, I will be in touch with your labs asap.  I am sorry that you are having such a hard time right now.  We need to find you a psychiatrist asap  This may be a good option:  [facility].  [facility] of [facility]  [place]  [place], [place] [address]  Please call this office or the psychiatrist of your choice TODAY and get an appointment pending.   Please try to avoid alcohol, and engage in calming activities and thoughts.  Let me get your labs back and hopefully we can offer you some medication now to get you started on improvement  If you are NOT ok- in any danger of self harm- please call 911 or go to the nearest ER immediately   We will restart your cholesterol and heartburn meds Will start lisinopril 20 mg daily for your high BP; you can start with a 1/2 tablet, we will adjust as needed  Please see me in 2 weeks

## 2020-05-06 ENCOUNTER — Encounter: Payer: Self-pay | Admitting: Family Medicine

## 2020-05-06 ENCOUNTER — Ambulatory Visit: Payer: No Typology Code available for payment source | Admitting: Family Medicine

## 2020-05-06 ENCOUNTER — Other Ambulatory Visit: Payer: Self-pay

## 2020-05-06 VITALS — BP 155/100 | HR 90 | Temp 96.8°F | Resp 17 | Ht 71.0 in | Wt 233.0 lb

## 2020-05-06 DIAGNOSIS — G4700 Insomnia, unspecified: Secondary | ICD-10-CM

## 2020-05-06 DIAGNOSIS — Z125 Encounter for screening for malignant neoplasm of prostate: Secondary | ICD-10-CM | POA: Diagnosis not present

## 2020-05-06 DIAGNOSIS — I2510 Atherosclerotic heart disease of native coronary artery without angina pectoris: Secondary | ICD-10-CM | POA: Diagnosis not present

## 2020-05-06 DIAGNOSIS — K219 Gastro-esophageal reflux disease without esophagitis: Secondary | ICD-10-CM

## 2020-05-06 DIAGNOSIS — R7303 Prediabetes: Secondary | ICD-10-CM | POA: Diagnosis not present

## 2020-05-06 DIAGNOSIS — Z130 Encounter for screening for diseases of the blood and blood-forming organs and certain disorders involving the immune mechanism: Secondary | ICD-10-CM | POA: Diagnosis not present

## 2020-05-06 DIAGNOSIS — E785 Hyperlipidemia, unspecified: Secondary | ICD-10-CM | POA: Diagnosis not present

## 2020-05-06 DIAGNOSIS — I10 Essential (primary) hypertension: Secondary | ICD-10-CM

## 2020-05-06 LAB — COMPREHENSIVE METABOLIC PANEL
ALT: 21 U/L (ref 0–53)
AST: 18 U/L (ref 0–37)
Albumin: 4.8 g/dL (ref 3.5–5.2)
Alkaline Phosphatase: 74 U/L (ref 39–117)
BUN: 11 mg/dL (ref 6–23)
CO2: 30 mEq/L (ref 19–32)
Calcium: 9.9 mg/dL (ref 8.4–10.5)
Chloride: 101 mEq/L (ref 96–112)
Creatinine, Ser: 1.07 mg/dL (ref 0.40–1.50)
GFR: 71.87 mL/min (ref >60.00)
Glucose, Bld: 119 mg/dL — ABNORMAL HIGH (ref 70–99)
Potassium: 4.8 mEq/L (ref 3.5–5.1)
Sodium: 137 mEq/L (ref 135–145)
Total Bilirubin: 0.5 mg/dL (ref 0.2–1.2)
Total Protein: 7.5 g/dL (ref 6.0–8.3)

## 2020-05-06 LAB — LIPID PANEL
Cholesterol: 207 mg/dL — ABNORMAL HIGH (ref 0–200)
HDL: 56.4 mg/dL (ref >39.00)
LDL Cholesterol: 134 mg/dL — ABNORMAL HIGH (ref 0–99)
NonHDL: 150.45
Total CHOL/HDL Ratio: 4
Triglycerides: 82 mg/dL (ref 0.0–149.0)
VLDL: 16.4 mg/dL (ref 0.0–40.0)

## 2020-05-06 LAB — CBC
HCT: 48.5 % (ref 39.0–52.0)
Hemoglobin: 17 g/dL (ref 13.0–17.0)
MCHC: 35.2 g/dL (ref 30.0–36.0)
MCV: 89.5 fl (ref 78.0–100.0)
Platelets: 270 10*3/uL (ref 150.0–400.0)
RBC: 5.42 Mil/uL (ref 4.22–5.81)
RDW: 13.5 % (ref 11.5–15.5)
WBC: 8.7 10*3/uL (ref 4.0–10.5)

## 2020-05-06 LAB — TSH: TSH: 2.76 u[IU]/mL (ref 0.35–4.50)

## 2020-05-06 LAB — HEMOGLOBIN A1C: Hgb A1c MFr Bld: 5.9 % (ref 4.6–6.5)

## 2020-05-06 LAB — PSA: PSA: 0.83 ng/mL (ref 0.10–4.00)

## 2020-05-06 MED ORDER — ROSUVASTATIN CALCIUM 20 MG PO TABS: 20.0000 mg | ORAL_TABLET | Freq: Every day | ORAL | 3 refills | Status: DC

## 2020-05-06 MED ORDER — LISINOPRIL 20 MG PO TABS: 20.0000 mg | ORAL_TABLET | Freq: Every day | ORAL | 3 refills | Status: DC

## 2020-05-06 MED ORDER — PANTOPRAZOLE SODIUM 40 MG PO TBEC: DELAYED_RELEASE_TABLET | ORAL | 3 refills | Status: DC

## 2020-05-07 ENCOUNTER — Encounter: Payer: Self-pay | Admitting: Family Medicine

## 2020-05-07 DIAGNOSIS — F3162 Bipolar disorder, current episode mixed, moderate: Secondary | ICD-10-CM

## 2020-05-07 MED ORDER — QUETIAPINE FUMARATE 50 MG PO TABS: 50.0000 mg | ORAL_TABLET | Freq: Every day | ORAL | 1 refills | Status: DC

## 2020-05-07 MED ORDER — DIVALPROEX SODIUM ER 500 MG PO TB24: 1000.0000 mg | ORAL_TABLET | Freq: Every day | ORAL | 0 refills | Status: DC

## 2020-05-08 ENCOUNTER — Encounter: Payer: Self-pay | Admitting: Family Medicine

## 2020-05-08 ENCOUNTER — Other Ambulatory Visit: Payer: Self-pay | Admitting: Family Medicine

## 2020-05-08 DIAGNOSIS — F3162 Bipolar disorder, current episode mixed, moderate: Secondary | ICD-10-CM

## 2020-05-08 NOTE — Progress Notes (Signed)
Patient coming in Thursday.

## 2020-05-08 NOTE — Progress Notes (Signed)
Hi Norman Anderson- this pt needs a Depakote level drawn next week- Thursday or Friday.  Needs to be done 8- 10 am.  Can you please help set this up?  Thank you!   The lab is ordered

## 2020-05-14 ENCOUNTER — Other Ambulatory Visit: Payer: Self-pay

## 2020-05-14 ENCOUNTER — Other Ambulatory Visit (INDEPENDENT_AMBULATORY_CARE_PROVIDER_SITE_OTHER): Payer: No Typology Code available for payment source

## 2020-05-14 DIAGNOSIS — F3162 Bipolar disorder, current episode mixed, moderate: Secondary | ICD-10-CM | POA: Diagnosis not present

## 2020-05-15 ENCOUNTER — Encounter: Payer: Self-pay | Admitting: Family Medicine

## 2020-05-15 LAB — VALPROIC ACID LEVEL: Valproic Acid Lvl: 78.8 mg/L (ref 50.0–100.0)

## 2020-05-19 ENCOUNTER — Telehealth: Payer: Self-pay | Admitting: Family Medicine

## 2020-05-19 NOTE — Telephone Encounter (Signed)
Patient's wife called in regards to patient  Referral to behavioral health. Per patient's wife [facility] has not received to referral   [facility] Fax number # [phone] Attention : Mandy B.

## 2020-05-20 NOTE — Telephone Encounter (Signed)
°  Refaxed to [facility] notified Norman Anderson it was refaxed to [facility] [facility]. She is very concerned. Advised her of ER at [facility] for [facility] if emergent. She said that she will call [facility] back again. She tried to schedule w/o the "referral" and they told her they couldn't.

## 2020-06-01 ENCOUNTER — Encounter: Payer: Self-pay | Admitting: Family Medicine

## 2020-06-01 DIAGNOSIS — F3162 Bipolar disorder, current episode mixed, moderate: Secondary | ICD-10-CM

## 2020-06-01 DIAGNOSIS — I2510 Atherosclerotic heart disease of native coronary artery without angina pectoris: Secondary | ICD-10-CM

## 2020-06-01 DIAGNOSIS — K219 Gastro-esophageal reflux disease without esophagitis: Secondary | ICD-10-CM

## 2020-06-01 DIAGNOSIS — Z5181 Encounter for therapeutic drug level monitoring: Secondary | ICD-10-CM

## 2020-06-01 DIAGNOSIS — I10 Essential (primary) hypertension: Secondary | ICD-10-CM

## 2020-06-01 DIAGNOSIS — E785 Hyperlipidemia, unspecified: Secondary | ICD-10-CM

## 2020-06-03 MED ORDER — QUETIAPINE FUMARATE 50 MG PO TABS: 75.0000 mg | ORAL_TABLET | Freq: Every day | ORAL | 1 refills | Status: AC

## 2020-06-03 MED ORDER — LISINOPRIL 20 MG PO TABS: 20.0000 mg | ORAL_TABLET | Freq: Every day | ORAL | 3 refills | Status: DC

## 2020-06-03 MED ORDER — DIVALPROEX SODIUM ER 500 MG PO TB24: 1000.0000 mg | ORAL_TABLET | Freq: Every day | ORAL | 0 refills | Status: AC

## 2020-06-03 MED ORDER — PANTOPRAZOLE SODIUM 40 MG PO TBEC: DELAYED_RELEASE_TABLET | ORAL | 3 refills | Status: DC

## 2020-06-03 MED ORDER — ROSUVASTATIN CALCIUM 20 MG PO TABS
20.0000 mg | ORAL_TABLET | Freq: Every day | ORAL | 3 refills | Status: DC
Start: 2020-06-03 — End: 2020-06-15

## 2020-06-03 NOTE — Addendum Note (Signed)
Addended by: Lamar Blinks C on: 06/03/2020 12:19 PM   Modules accepted: Orders

## 2020-06-03 NOTE — Addendum Note (Signed)
Addended by: Lamar Blinks C on: 06/03/2020 10:54 AM   Modules accepted: Orders

## 2020-06-13 NOTE — Progress Notes (Signed)
Cardiology Office Note:    Date:  06/15/2020   ID:  Norman Anderson, DOB [birth year], MRN [id number]  PCP:  Darreld Mclean, MD  Cardiologist:  Shirlee More, MD   Referring MD: Darreld Mclean, MD  ASSESSMENT:    1. Coronary artery disease of native artery of native heart with stable angina pectoris ([place])   2. Essential hypertension   3. Mixed hyperlipidemia   4. Chest pain of uncertain etiology    PLAN:    In order of problems listed above:  1. Known CAD high risk with statin intolerance ongoing cigarette smoking will undergo myocardial perfusion study with atypical chest pain.  If abnormal I would favor coronary angiography.  Continue aspirin and initiate lipid-lowering treatment PCSK9 inhibitor. 2. Stable BP at target continue statin 3. Initiate PCSK9 therapy the second drug as needed Zetia would be appropriate.  Check lipid profile LP(a) level in 6 weeks  Next appointment 6 weeks   Medication Adjustments/Labs and Tests Ordered: Current medicines are reviewed at length with the patient today.  Concerns regarding medicines are outlined above.  Orders Placed This Encounter  Procedures   Comprehensive metabolic panel   Lipid panel   Lipoprotein A (LPA)   AMB Referral to [facility]   EKG 12-Lead   No orders of the defined types were placed in this encounter.    Chief Complaint  Patient presents with   Coronary Artery Disease    To establish cardiology care    History of Present Illness:    Norman Anderson is a 54 y.o. male who is being seen today for the evaluation of CAD at the request of Copland, Gay Filler, MD. He was previously seen by Dr. Burt Knack 05/04/2017 with a history of inferior posterior myocardial infarction in 2014 treated in [facility] with PCI and stent left circumflex coronary artery. He had a carotid duplex performed 07/18/2018 showing minimal plaque and no state significant  stenosis in the extracranial cerebrovascular vessels His last EKG 07/11/2018 showed sinus bradycardia otherwise normal.  In his mind his primary problem is insomnia PTSD fatigue and intolerance of statins.  He is not taking rosuvastatin and says that it clouds his thought affect sleep and produces joint pain.  He has premature CAD high risk continues to smoke and agrees to accept PCSK9 inhibitor therapy.  He has had some chest discomfort is vague brief nonanginal substernal however he is at high risk will undergo further evaluation with myocardial perfusion study.  If he has high risk markers he benefit from cardiac interventions.  He attributes his symptoms to cigarette smoking no orthopnea edema palpitation or syncope. Past Medical History:  Diagnosis Date   Coronary atherosclerosis of native coronary artery March 2014   inferoposterior MI, DES - left circumflex   HTN (hypertension)    Hyperlipidemia     Past Surgical History:  Procedure Laterality Date   CORONARY ANGIOPLASTY WITH STENT PLACEMENT  02/02/2013    Current Medications: Current Meds  Medication Sig   aspirin 81 MG tablet Take 81 mg by mouth daily.   divalproex (DEPAKOTE ER) 500 MG 24 hr tablet Take 2 tablets (1,000 mg total) by mouth daily. At bedtime   lisinopril (ZESTRIL) 20 MG tablet Take 1 tablet (20 mg total) by mouth daily.   pantoprazole (PROTONIX) 40 MG tablet TAKE 1 TABLET DAILY- use as needed for GERD   QUEtiapine (SEROQUEL) 50 MG tablet Take 1.5-2 tablets (75-100 mg total) by  mouth at bedtime.   [DISCONTINUED] rosuvastatin (CRESTOR) 20 MG tablet Take 1 tablet (20 mg total) by mouth daily.     Allergies:   Patient has no known allergies.   Social History   Socioeconomic History   Marital status: Married    Spouse name: Not on file   Number of children: 4   Years of education: Not on file   Highest education level: Not on file  Occupational History   Occupation: Retired  Tobacco Use    Smoking status: Former Smoker    Packs/day: 0.50    Types: Cigarettes    Quit date: 2020    Years since quitting: 1.5   Smokeless tobacco: Never Used   Tobacco comment: Started smoking again recently (05/04/17)  Vaping Use   Vaping Use: Every day  Substance and Sexual Activity   Alcohol use: Yes    Alcohol/week: 2.0 standard drinks    Types: 2 Cans of beer per week    Comment: mixture   Drug use: No   Sexual activity: Yes  Other Topics Concern   Not on file  Social History Narrative   Retired Designer, industrial/product. Quit smoking August 2014. 2-3 drinks per day. Exercises regularly. 4 children.   Social Determinants of Health   Financial Resource Strain:    Difficulty of Paying Living Expenses:   Food Insecurity:    Worried About Charity fundraiser in the Last Year:    Arboriculturist in the Last Year:   Transportation Needs:    Film/video editor (Medical):    Lack of Transportation (Non-Medical):   Physical Activity:    Days of Exercise per Week:    Minutes of Exercise per Session:   Stress:    Feeling of Stress :   Social Connections:    Frequency of Communication with Friends and Family:    Frequency of Social Gatherings with Friends and Family:    Attends Religious Services:    Active Member of Clubs or Organizations:    Attends Archivist Meetings:    Marital Status:      Family History: The patient's family history includes Coronary artery disease (age of onset: 53) in his father; Heart attack in his father. There is no history of Colon cancer.  ROS:   Review of Systems  Constitutional: Positive for malaise/fatigue.  HENT: Negative.   Eyes: Negative.   Cardiovascular: Positive for chest pain.  Respiratory: Negative.   Endocrine: Negative.   Hematologic/Lymphatic: Negative.   Skin: Negative.   Musculoskeletal: Negative.   Gastrointestinal: Negative.   Genitourinary: Negative.   Neurological: Positive for difficulty  with concentration.  Psychiatric/Behavioral: The patient has insomnia.   Allergic/Immunologic: Negative.    Please see the history of present illness.     All other systems reviewed and are negative.  EKGs/Labs/Other Studies Reviewed:    The following studies were reviewed today:   EKG:  EKG is  ordered today.  The ekg ordered today is personally reviewed and demonstrates sinus rhythm old inferior posterior MI  Recent Labs: 05/06/2020: ALT 21; BUN 11; Creatinine, Ser 1.07; Hemoglobin 17.0; Platelets 270.0; Potassium 4.8; Sodium 137; TSH 2.76  Recent Lipid Panel    Component Value Date/Time   CHOL 207 (H) 05/06/2020 1022   TRIG 82.0 05/06/2020 1022   HDL 56.40 05/06/2020 1022   CHOLHDL 4 05/06/2020 1022   VLDL 16.4 05/06/2020 1022   LDLCALC 134 (H) 05/06/2020 1022    Physical Exam:  VS:  BP 118/88    Pulse 92    Ht 5\' 11"  (1.803 m)    Wt 241 lb 1.3 oz (109.4 kg)    SpO2 98%    BMI 33.62 kg/m     Wt Readings from Last 3 Encounters:  06/15/20 241 lb 1.3 oz (109.4 kg)  05/06/20 233 lb (105.7 kg)  07/11/18 255 lb (115.7 kg)     GEN: Deeply pigmented well nourished, well developed in no acute distress HEENT: Normal NECK: No JVD; No carotid bruits LYMPHATICS: No lymphadenopathy CARDIAC: RRR, no murmurs, rubs, gallops RESPIRATORY:  Clear to auscultation without rales, wheezing or rhonchi  ABDOMEN: Soft, non-tender, non-distended MUSCULOSKELETAL:  No edema; No deformity  SKIN: Warm and dry NEUROLOGIC:  Alert and oriented x 3 PSYCHIATRIC:  Normal affect     Signed, Shirlee More, MD  06/15/2020 2:34 PM    [place]

## 2020-06-15 ENCOUNTER — Ambulatory Visit (INDEPENDENT_AMBULATORY_CARE_PROVIDER_SITE_OTHER): Payer: No Typology Code available for payment source | Admitting: Cardiology

## 2020-06-15 ENCOUNTER — Other Ambulatory Visit: Payer: Self-pay

## 2020-06-15 ENCOUNTER — Encounter: Payer: Self-pay | Admitting: Cardiology

## 2020-06-15 VITALS — BP 118/88 | HR 92 | Ht 71.0 in | Wt 241.1 lb

## 2020-06-15 DIAGNOSIS — E782 Mixed hyperlipidemia: Secondary | ICD-10-CM | POA: Diagnosis not present

## 2020-06-15 DIAGNOSIS — R079 Chest pain, unspecified: Secondary | ICD-10-CM | POA: Diagnosis not present

## 2020-06-15 DIAGNOSIS — I10 Essential (primary) hypertension: Secondary | ICD-10-CM

## 2020-06-15 DIAGNOSIS — I25118 Atherosclerotic heart disease of native coronary artery with other forms of angina pectoris: Secondary | ICD-10-CM | POA: Diagnosis not present

## 2020-06-15 NOTE — Patient Instructions (Addendum)
Medication Instructions:  Your physician has recommended you make the following change in your medication:  STOP Rosuvastatin once starting Repatha.  We have put in a referral for you to see the lipid clinic in [place] to help you to get started on Repatha. They will call you to schedule an appointment with them.    *If you need a refill on your cardiac medications before your next appointment, please call your pharmacy*   Lab Work: Your physician recommends that you return for lab work in: 6 weeks after starting [place], Lipids, Lpa If you have labs (blood work) drawn today and your tests are completely normal, you will receive your results only by: Marland Kitchen MyChart Message (if you have MyChart) OR . A paper copy in the mail If you have any lab test that is abnormal or we need to change your treatment, we will call you to review the results.   Testing/Procedures:   [facility] [address] [place], [place] [address] Phone:  [phone]    Please arrive 15 minutes prior to your appointment time for registration and insurance purposes.  The test will take approximately 3 to 4 hours to complete; you may bring reading material.  If someone comes with you to your appointment, they will need to remain in the main lobby due to limited space in the testing area. **If you are pregnant or breastfeeding, please notify the nuclear lab prior to your appointment**  How to prepare for your Myocardial Perfusion Test: . Do not eat or drink 3 hours prior to your test, except you may have water. . Do not consume products containing caffeine (regular or decaffeinated) 12 hours prior to your test. (ex: coffee, chocolate, sodas, tea). . Do bring a list of your current medications with you.  If not listed below, you may take your medications as normal. . Do wear comfortable clothes (no dresses or overalls) and walking shoes, tennis shoes preferred (No heels or open toe  shoes are allowed). . Do NOT wear cologne, perfume, aftershave, or lotions (deodorant is allowed). . If these instructions are not followed, your test will have to be rescheduled.  Please report to [address] for your test.  If you have questions or concerns about your appointment, you can call the [facility] at [phone].  If you cannot keep your appointment, please provide 24 hours notification to the Nuclear Lab, to avoid a possible $50 charge to your account.    Follow-Up: At [facility], you and your health needs are our priority.  As part of our continuing mission to provide you with exceptional heart care, we have created designated Provider Care Teams.  These Care Teams include your primary Cardiologist (physician) and Advanced Practice Providers (APPs -  Physician Assistants and Nurse Practitioners) who all work together to provide you with the care you need, when you need it.  We recommend signing up for the patient portal called "MyChart".  Sign up information is provided on this After Visit Summary.  MyChart is used to connect with patients for Virtual Visits (Telemedicine).  Patients are able to view lab/test results, encounter notes, upcoming appointments, etc.  Non-urgent messages can be sent to your provider as well.   To learn more about what you can do with MyChart, go to [email or web address].    Your next appointment:   6 week(s)  The format for your next appointment:   In Person  Provider:  Shirlee More, MD   Other Instructions

## 2020-06-17 ENCOUNTER — Telehealth (HOSPITAL_COMMUNITY): Payer: Self-pay | Admitting: *Deleted

## 2020-06-17 NOTE — Telephone Encounter (Signed)
Patient given detailed instructions per Myocardial Perfusion Study Information Sheet for the test on 06/24/2020 at 10. Patient notified to arrive 15 minutes early and that it is imperative to arrive on time for appointment to keep from having the test rescheduled.  If you need to cancel or reschedule your appointment, please call the office within 24 hours of your appointment. . Patient verbalized understanding.Alyene Predmore, Ranae Palms Patient had instructions and did not want mychart letter sent

## 2020-06-24 ENCOUNTER — Other Ambulatory Visit: Payer: Self-pay

## 2020-06-24 ENCOUNTER — Encounter (HOSPITAL_COMMUNITY): Payer: Self-pay

## 2020-06-24 ENCOUNTER — Telehealth: Payer: Self-pay

## 2020-06-24 ENCOUNTER — Ambulatory Visit (HOSPITAL_COMMUNITY): Payer: No Typology Code available for payment source | Attending: Cardiology

## 2020-06-24 DIAGNOSIS — R079 Chest pain, unspecified: Secondary | ICD-10-CM

## 2020-06-24 LAB — MYOCARDIAL PERFUSION IMAGING
LV dias vol: 112 mL (ref 62–150)
LV sys vol: 65 mL
Peak HR: 90 {beats}/min
Rest HR: 62 {beats}/min
SDS: 0
SRS: 17
SSS: 18
TID: 1.11

## 2020-06-24 MED ORDER — TECHNETIUM TC 99M TETROFOSMIN IV KIT
31.9000 | PACK | Freq: Once | INTRAVENOUS | Status: AC | PRN
  Administered 2020-06-24: 31.9 via INTRAVENOUS
  Filled 2020-06-24: qty 32

## 2020-06-24 MED ORDER — ENTRESTO 49-51 MG PO TABS: 1.0000 | ORAL_TABLET | Freq: Two times a day (BID) | ORAL | 3 refills | Status: DC

## 2020-06-24 MED ORDER — TECHNETIUM TC 99M TETROFOSMIN IV KIT
10.3000 | PACK | Freq: Once | INTRAVENOUS | Status: AC | PRN
  Administered 2020-06-24: 10.3 via INTRAVENOUS
  Filled 2020-06-24: qty 11

## 2020-06-24 MED ORDER — REGADENOSON 0.4 MG/5ML IV SOLN
0.4000 mg | Freq: Once | INTRAVENOUS | Status: AC
  Administered 2020-06-24: 0.4 mg via INTRAVENOUS

## 2020-06-24 NOTE — Telephone Encounter (Signed)
-----   Message from Richardo Priest, MD sent at 06/24/2020  3:48 PM EDT ----- There is a little bit of a complicated report.  The good news is there is no sign he needs another heart catheterization or stent.  They are concerned that his heart muscle function is mildly to moderately reduced and I think that he needs to switch from lisinopril to [facility].  For safety I like him to miss 3 full days and then will transition to mid dose 49/51 twice daily and he should have an appointment to see me in the office in follow-up.  Generally what this will do with his stabilize and very often improve the heart muscle function long-term

## 2020-06-24 NOTE — Telephone Encounter (Signed)
Telephone note started in order to discontinue the lisinopril in the patients chart.

## 2020-06-24 NOTE — Telephone Encounter (Signed)
Spoke with patient regarding results and recommendation.  Patient verbalizes understanding and is agreeable to plan of care. Advised patient to call back with any issues or concerns.  

## 2020-06-25 ENCOUNTER — Other Ambulatory Visit (INDEPENDENT_AMBULATORY_CARE_PROVIDER_SITE_OTHER): Payer: No Typology Code available for payment source

## 2020-06-25 DIAGNOSIS — Z5181 Encounter for therapeutic drug level monitoring: Secondary | ICD-10-CM

## 2020-06-25 LAB — COMPREHENSIVE METABOLIC PANEL
ALT: 27 U/L (ref 0–53)
AST: 18 U/L (ref 0–37)
Albumin: 4.3 g/dL (ref 3.5–5.2)
Alkaline Phosphatase: 62 U/L (ref 39–117)
BUN: 11 mg/dL (ref 6–23)
CO2: 28 mEq/L (ref 19–32)
Calcium: 9.4 mg/dL (ref 8.4–10.5)
Chloride: 103 mEq/L (ref 96–112)
Creatinine, Ser: 1 mg/dL (ref 0.40–1.50)
GFR: 77.67 mL/min (ref >60.00)
Glucose, Bld: 118 mg/dL — ABNORMAL HIGH (ref 70–99)
Potassium: 4.3 mEq/L (ref 3.5–5.1)
Sodium: 138 mEq/L (ref 135–145)
Total Bilirubin: 0.4 mg/dL (ref 0.2–1.2)
Total Protein: 6.9 g/dL (ref 6.0–8.3)

## 2020-06-25 LAB — CBC
HCT: 43.5 % (ref 39.0–52.0)
Hemoglobin: 15 g/dL (ref 13.0–17.0)
MCHC: 34.4 g/dL (ref 30.0–36.0)
MCV: 90.3 fl (ref 78.0–100.0)
Platelets: 243 10*3/uL (ref 150.0–400.0)
RBC: 4.82 Mil/uL (ref 4.22–5.81)
RDW: 12.8 % (ref 11.5–15.5)
WBC: 9.2 10*3/uL (ref 4.0–10.5)

## 2020-06-25 LAB — AMMONIA: Ammonia: 38 umol/L — ABNORMAL HIGH (ref 11–35)

## 2020-06-26 ENCOUNTER — Encounter: Payer: Self-pay | Admitting: Family Medicine

## 2020-06-26 LAB — VALPROIC ACID LEVEL: Valproic Acid Lvl: 57.2 mg/L (ref 50.0–100.0)

## 2020-06-29 MED ORDER — ENTRESTO 49-51 MG PO TABS: 1.0000 | ORAL_TABLET | Freq: Two times a day (BID) | ORAL | 3 refills | Status: DC

## 2020-06-30 ENCOUNTER — Ambulatory Visit: Payer: No Typology Code available for payment source

## 2020-07-16 NOTE — Progress Notes (Signed)
Cardiology Office Note:    Date:  07/17/2020   ID:  Norman Anderson, DOB Mar 24, 1966, MRN [id number]  PCP:  Darreld Mclean, MD  Cardiologist:  Shirlee More, MD    Referring MD: Darreld Mclean, MD    ASSESSMENT:    1. LV dysfunction   2. Coronary artery disease of native artery of native heart with stable angina pectoris ([place])   3. Mixed hyperlipidemia    PLAN:    In order of problems listed above:  1. He understands agrees with Delene Loll uptitrate to maximum dose next visit follow-up echocardiogram in about 6 months. 2. Stable CAD no objective ischemia on perfusion imaging having no angina encouraged activity medical therapy 3. Initiate Livalo if intolerant will need PCSK9 inhibitor   Next appointment: 3 months   Medication Adjustments/Labs and Tests Ordered: Current medicines are reviewed at length with the patient today.  Concerns regarding medicines are outlined above.  No orders of the defined types were placed in this encounter.  No orders of the defined types were placed in this encounter.   Chief Complaint  Patient presents with  . Follow-up    After myocardial perfusion test    History of Present Illness:    Norman Anderson is a 54 y.o. male with a hx of CAD with inferior posterior myocardial infarction in 2014 treated in [facility] with PCI and stent left circumflex coronary artery. He had a carotid duplex performed 07/18/2018 showing minimal plaque and no significant stenosis in the extracranial cerebrovascular vessels. He was last seen 06/15/2020.  With reduced ejection fraction he was initiated on Entresto. Compliance with diet, lifestyle and medications: Yes  I am pleased he came today I reviewed his test with him and he perceives that his 42% was related to 100% and that he had severe left ventricular dysfunction thought he had heart failure.  I reviewed the testing results with him the idea of using Entresto prior to the development of heart  failure with reduced ejection fraction he is reassured.  He had no trouble in transition his repeat blood pressure by me is 142/90.  He is concerned of cost has Pharmacist, community and will give him a discount card and uptitrate today.  He is hesitant to take statins that he has had side effects in the past but said he would try Livalo and we will plan to check his lipids in a month if intolerant he will need PCSK9 inhibitor.  He is having no angina edema shortness of breath he is involved in a good exercise program encouraged 30 minutes of activity a day and we discussed using weights and I told him light weights with reps are appropriate  MPI 06/24/2020: Study Highlights  Nuclear stress EF: 42%.  The left ventricular ejection fraction is moderately decreased (30-44%).  There was no ST segment deviation noted during stress.  There is a large defect of severe severity present in the basal inferior, basal inferolateral, mid inferior, mid inferolateral, apical inferior and apical lateral location. The defect is non-reversible. This is consistent with prior infarct. No ischemia noted.  This is an intermediate risk study.  Past Medical History:  Diagnosis Date  . Coronary atherosclerosis of native coronary artery March 2014   inferoposterior MI, DES - left circumflex  . HTN (hypertension)   . Hyperlipidemia     Past Surgical History:  Procedure Laterality Date  . CORONARY ANGIOPLASTY WITH STENT PLACEMENT  02/02/2013    Current Medications: Current Meds  Medication Sig  . aspirin 81 MG tablet Take 81 mg by mouth daily.  . divalproex (DEPAKOTE ER) 500 MG 24 hr tablet Take 2 tablets (1,000 mg total) by mouth daily. At bedtime  . pantoprazole (PROTONIX) 40 MG tablet TAKE 1 TABLET DAILY- use as needed for GERD  . QUEtiapine (SEROQUEL) 50 MG tablet Take 1.5-2 tablets (75-100 mg total) by mouth at bedtime.  . sacubitril-valsartan (ENTRESTO) 49-51 MG Take 1 tablet by mouth 2 (two) times daily.       Allergies:   Patient has no known allergies.   Social History   Socioeconomic History  . Marital status: Married    Spouse name: Not on file  . Number of children: 4  . Years of education: Not on file  . Highest education level: Not on file  Occupational History  . Occupation: Retired  Tobacco Use  . Smoking status: Current Every Day Smoker    Packs/day: 0.50    Types: Cigarettes, E-cigarettes    Last attempt to quit: 2020    Years since quitting: 1.6  . Smokeless tobacco: Never Used  . Tobacco comment: Started smoking again recently (05/04/17)  Vaping Use  . Vaping Use: Every day  Substance and Sexual Activity  . Alcohol use: Yes    Alcohol/week: 2.0 standard drinks    Types: 2 Cans of beer per week    Comment: mixture  . Drug use: No  . Sexual activity: Yes  Other Topics Concern  . Not on file  Social History Narrative   Retired Designer, industrial/product. Quit smoking August 2014. 2-3 drinks per day. Exercises regularly. 4 children.   Social Determinants of Health   Financial Resource Strain:   . Difficulty of Paying Living Expenses:   Food Insecurity:   . Worried About Charity fundraiser in the Last Year:   . Arboriculturist in the Last Year:   Transportation Needs:   . Film/video editor (Medical):   Marland Kitchen Lack of Transportation (Non-Medical):   Physical Activity:   . Days of Exercise per Week:   . Minutes of Exercise per Session:   Stress:   . Feeling of Stress :   Social Connections:   . Frequency of Communication with Friends and Family:   . Frequency of Social Gatherings with Friends and Family:   . Attends Religious Services:   . Active Member of Clubs or Organizations:   . Attends Archivist Meetings:   Marland Kitchen Marital Status:      Family History: The patient's family history includes Coronary artery disease (age of onset: 38) in his father; Heart attack in his father. There is no history of Colon cancer. ROS:   Please see the history of  present illness.    All other systems reviewed and are negative.  EKGs/Labs/Other Studies Reviewed:    The following studies were reviewed today:    Recent Labs: 05/06/2020: TSH 2.76 06/25/2020: ALT 27; BUN 11; Creatinine, Ser 1.00; Hemoglobin 15.0; Platelets 243.0; Potassium 4.3; Sodium 138  Recent Lipid Panel    Component Value Date/Time   CHOL 207 (H) 05/06/2020 1022   TRIG 82.0 05/06/2020 1022   HDL 56.40 05/06/2020 1022   CHOLHDL 4 05/06/2020 1022   VLDL 16.4 05/06/2020 1022   LDLCALC 134 (H) 05/06/2020 1022    Physical Exam:    VS:  BP (!) 150/90 (BP Location: Left Arm, Patient Position: Sitting, Cuff Size: Normal)   Pulse 100   Ht 5\' 11"  (  1.803 m)   Wt 247 lb (112 kg)   SpO2 96%   BMI 34.45 kg/m     Wt Readings from Last 3 Encounters:  07/17/20 247 lb (112 kg)  06/24/20 241 lb (109.3 kg)  06/15/20 241 lb 1.3 oz (109.4 kg)     GEN:  Well nourished, well developed in no acute distress HEENT: Normal NECK: No JVD; No carotid bruits LYMPHATICS: No lymphadenopathy CARDIAC: RRR, no murmurs, rubs, gallops RESPIRATORY:  Clear to auscultation without rales, wheezing or rhonchi  ABDOMEN: Soft, non-tender, non-distended MUSCULOSKELETAL:  No edema; No deformity  SKIN: Warm and dry NEUROLOGIC:  Alert and oriented x 3 PSYCHIATRIC:  Normal affect    Signed, Shirlee More, MD  07/17/2020 10:29 AM    [facility]

## 2020-07-17 ENCOUNTER — Encounter: Payer: Self-pay | Admitting: Cardiology

## 2020-07-17 ENCOUNTER — Ambulatory Visit: Payer: No Typology Code available for payment source | Admitting: Cardiology

## 2020-07-17 ENCOUNTER — Other Ambulatory Visit: Payer: Self-pay

## 2020-07-17 VITALS — BP 150/90 | HR 100 | Ht 71.0 in | Wt 247.0 lb

## 2020-07-17 DIAGNOSIS — I519 Heart disease, unspecified: Secondary | ICD-10-CM

## 2020-07-17 DIAGNOSIS — E782 Mixed hyperlipidemia: Secondary | ICD-10-CM

## 2020-07-17 DIAGNOSIS — I25118 Atherosclerotic heart disease of native coronary artery with other forms of angina pectoris: Secondary | ICD-10-CM | POA: Diagnosis not present

## 2020-07-17 MED ORDER — ENTRESTO 97-103 MG PO TABS: 1.0000 | ORAL_TABLET | Freq: Two times a day (BID) | ORAL | 3 refills | Status: AC

## 2020-07-17 MED ORDER — LIVALO 2 MG PO TABS: 2.0000 mg | ORAL_TABLET | Freq: Every day | ORAL | 3 refills | Status: AC

## 2020-07-17 NOTE — Patient Instructions (Signed)
Medication Instructions:  Your physician has recommended you make the following change in your medication:  START: Livalo 2 mg take one tablet by mouth daily.  INCREASE:  Entresto 97/103 mg take one tablet by mouth twice daily.  *If you need a refill on your cardiac medications before your next appointment, please call your pharmacy*   Lab Work: Your physician recommends that you return for lab work in: 1 month Lipids, CMP  You must be fasting for these labs to be completed.   If you have labs (blood work) drawn today and your tests are completely normal, you will receive your results only by: Marland Kitchen MyChart Message (if you have MyChart) OR . A paper copy in the mail If you have any lab test that is abnormal or we need to change your treatment, we will call you to review the results.   Testing/Procedures: None   Follow-Up: At [facility], you and your health needs are our priority.  As part of our continuing mission to provide you with exceptional heart care, we have created designated Provider Care Teams.  These Care Teams include your primary Cardiologist (physician) and Advanced Practice Providers (APPs -  Physician Assistants and Nurse Practitioners) who all work together to provide you with the care you need, when you need it.  We recommend signing up for the patient portal called "MyChart".  Sign up information is provided on this After Visit Summary.  MyChart is used to connect with patients for Virtual Visits (Telemedicine).  Patients are able to view lab/test results, encounter notes, upcoming appointments, etc.  Non-urgent messages can be sent to your provider as well.   To learn more about what you can do with MyChart, go to [email or web address].    Your next appointment:   3 month(s)  The format for your next appointment:   In Person  Provider:   Shirlee More, MD   Other Instructions

## 2020-07-27 ENCOUNTER — Encounter: Payer: Self-pay | Admitting: General Practice

## 2020-08-17 ENCOUNTER — Ambulatory Visit: Payer: No Typology Code available for payment source | Admitting: Cardiology

## 2020-10-21 ENCOUNTER — Encounter: Payer: Self-pay | Admitting: Family Medicine

## 2020-10-21 DIAGNOSIS — F3162 Bipolar disorder, current episode mixed, moderate: Secondary | ICD-10-CM

## 2020-11-05 ENCOUNTER — Encounter: Payer: Self-pay | Admitting: Family Medicine

## 2020-11-05 DIAGNOSIS — F3160 Bipolar disorder, current episode mixed, unspecified: Secondary | ICD-10-CM | POA: Insufficient documentation

## 2021-06-14 ENCOUNTER — Other Ambulatory Visit: Payer: Self-pay | Admitting: Family Medicine

## 2021-06-14 DIAGNOSIS — K219 Gastro-esophageal reflux disease without esophagitis: Secondary | ICD-10-CM

## 2021-12-28 ENCOUNTER — Other Ambulatory Visit: Payer: Self-pay | Admitting: Family Medicine

## 2021-12-28 DIAGNOSIS — K219 Gastro-esophageal reflux disease without esophagitis: Secondary | ICD-10-CM

## 2022-04-14 ENCOUNTER — Other Ambulatory Visit: Payer: Self-pay

## 2022-04-14 ENCOUNTER — Emergency Department (HOSPITAL_BASED_OUTPATIENT_CLINIC_OR_DEPARTMENT_OTHER)
Admission: EM | Admit: 2022-04-14 | Discharge: 2022-04-14 | Disposition: A | Discharge status: Home / Self Care | Payer: PRIVATE HEALTH INSURANCE | Attending: Emergency Medicine | Admitting: Emergency Medicine

## 2022-04-14 ENCOUNTER — Encounter (HOSPITAL_BASED_OUTPATIENT_CLINIC_OR_DEPARTMENT_OTHER): Payer: Self-pay | Admitting: Urology

## 2022-04-14 ENCOUNTER — Emergency Department (HOSPITAL_BASED_OUTPATIENT_CLINIC_OR_DEPARTMENT_OTHER): Payer: PRIVATE HEALTH INSURANCE

## 2022-04-14 DIAGNOSIS — S80811A Abrasion, right lower leg, initial encounter: Secondary | ICD-10-CM | POA: Insufficient documentation

## 2022-04-14 DIAGNOSIS — Z23 Encounter for immunization: Secondary | ICD-10-CM | POA: Diagnosis not present

## 2022-04-14 DIAGNOSIS — R109 Unspecified abdominal pain: Secondary | ICD-10-CM | POA: Diagnosis not present

## 2022-04-14 DIAGNOSIS — M25512 Pain in left shoulder: Secondary | ICD-10-CM | POA: Insufficient documentation

## 2022-04-14 DIAGNOSIS — W11XXXA Fall on and from ladder, initial encounter: Secondary | ICD-10-CM | POA: Insufficient documentation

## 2022-04-14 DIAGNOSIS — S8991XA Unspecified injury of right lower leg, initial encounter: Secondary | ICD-10-CM | POA: Diagnosis present

## 2022-04-14 DIAGNOSIS — R0781 Pleurodynia: Secondary | ICD-10-CM | POA: Insufficient documentation

## 2022-04-14 LAB — URINALYSIS, ROUTINE W REFLEX MICROSCOPIC
Bilirubin Urine: NEGATIVE
Glucose, UA: NEGATIVE mg/dL
Hgb urine dipstick: NEGATIVE
Ketones, ur: NEGATIVE mg/dL
Leukocytes,Ua: NEGATIVE
Nitrite: NEGATIVE
Protein, ur: NEGATIVE mg/dL
Specific Gravity, Urine: <=1.005 (ref 1.005–1.030)
pH: 5 (ref 5.0–8.0)

## 2022-04-14 LAB — COMPREHENSIVE METABOLIC PANEL
ALT: 32 U/L (ref 0–44)
AST: 30 U/L (ref 15–41)
Albumin: 4.1 g/dL (ref 3.5–5.0)
Alkaline Phosphatase: 53 U/L (ref 38–126)
Anion gap: 7 (ref 5–15)
BUN: 14 mg/dL (ref 6–20)
CO2: 22 mmol/L (ref 22–32)
Calcium: 8.9 mg/dL (ref 8.9–10.3)
Chloride: 106 mmol/L (ref 98–111)
Creatinine, Ser: 0.9 mg/dL (ref 0.61–1.24)
GFR, Estimated: >60 mL/min (ref >60)
Glucose, Bld: 132 mg/dL — ABNORMAL HIGH (ref 70–99)
Potassium: 4 mmol/L (ref 3.5–5.1)
Sodium: 135 mmol/L (ref 135–145)
Total Bilirubin: 1 mg/dL (ref 0.3–1.2)
Total Protein: 7.1 g/dL (ref 6.5–8.1)

## 2022-04-14 LAB — TROPONIN I (HIGH SENSITIVITY)
Troponin I (High Sensitivity): 13 ng/L (ref <18)
Troponin I (High Sensitivity): 13 ng/L (ref <18)

## 2022-04-14 LAB — CBC
HCT: 42.4 % (ref 39.0–52.0)
Hemoglobin: 15.2 g/dL (ref 13.0–17.0)
MCH: 30.6 pg (ref 26.0–34.0)
MCHC: 35.8 g/dL (ref 30.0–36.0)
MCV: 85.3 fL (ref 80.0–100.0)
Platelets: 286 10*3/uL (ref 150–400)
RBC: 4.97 MIL/uL (ref 4.22–5.81)
RDW: 12.4 % (ref 11.5–15.5)
WBC: 11.7 10*3/uL — ABNORMAL HIGH (ref 4.0–10.5)
nRBC: 0 % (ref 0.0–0.2)

## 2022-04-14 MED ORDER — KETOROLAC TROMETHAMINE 30 MG/ML IJ SOLN
30.0000 mg | Freq: Once | INTRAMUSCULAR | Status: AC
Start: 2022-04-14 — End: 2022-04-14
  Administered 2022-04-14: 30 mg via INTRAVENOUS
  Filled 2022-04-14: qty 1

## 2022-04-14 MED ORDER — HYDROMORPHONE HCL 1 MG/ML IJ SOLN
1.0000 mg | Freq: Once | INTRAMUSCULAR | Status: DC
  Filled 2022-04-14: qty 1

## 2022-04-14 MED ORDER — KETOROLAC TROMETHAMINE 15 MG/ML IJ SOLN
15.0000 mg | Freq: Once | INTRAMUSCULAR | Status: DC
  Filled 2022-04-14: qty 1

## 2022-04-14 MED ORDER — DIAZEPAM 5 MG PO TABS
5.0000 mg | ORAL_TABLET | Freq: Once | ORAL | Status: AC
  Administered 2022-04-14: 5 mg via ORAL
  Filled 2022-04-14: qty 1

## 2022-04-14 MED ORDER — IOHEXOL 300 MG/ML  SOLN
125.0000 mL | Freq: Once | INTRAMUSCULAR | Status: AC | PRN
  Administered 2022-04-14: 125 mL via INTRAVENOUS

## 2022-04-14 MED ORDER — CYCLOBENZAPRINE HCL 10 MG PO TABS: 10.0000 mg | ORAL_TABLET | Freq: Two times a day (BID) | ORAL | 0 refills | Status: AC | PRN

## 2022-04-14 MED ORDER — HYDROMORPHONE HCL 1 MG/ML IJ SOLN
1.0000 mg | Freq: Once | INTRAMUSCULAR | Status: AC
  Administered 2022-04-14: 1 mg via INTRAVENOUS

## 2022-04-14 MED ORDER — ONDANSETRON HCL 4 MG/2ML IJ SOLN
4.0000 mg | Freq: Once | INTRAMUSCULAR | Status: AC
  Administered 2022-04-14: 4 mg via INTRAVENOUS
  Filled 2022-04-14: qty 2

## 2022-04-14 MED ORDER — MORPHINE SULFATE (PF) 4 MG/ML IV SOLN
4.0000 mg | Freq: Once | INTRAVENOUS | Status: AC
  Administered 2022-04-14: 4 mg via INTRAVENOUS
  Filled 2022-04-14: qty 1

## 2022-04-14 MED ORDER — TETANUS-DIPHTH-ACELL PERTUSSIS 5-2.5-18.5 LF-MCG/0.5 IM SUSY
0.5000 mL | PREFILLED_SYRINGE | Freq: Once | INTRAMUSCULAR | Status: AC
  Administered 2022-04-14: 0.5 mL via INTRAMUSCULAR
  Filled 2022-04-14: qty 0.5

## 2022-04-14 MED ORDER — OXYCODONE-ACETAMINOPHEN 5-325 MG PO TABS: 1.0000 | ORAL_TABLET | Freq: Four times a day (QID) | ORAL | 0 refills | Status: AC | PRN

## 2022-04-14 NOTE — ED Notes (Signed)
Patient stated that he can not void  ?

## 2022-04-14 NOTE — ED Provider Notes (Signed)
?[facility] EMER[facility]ENC[facility] DEPARTMENT ?Provider Note ? ? ?CSN: [id number] ?Arrival date & time: 04/14/22  1508 ? ?  ? ?History ? ?Chief Complaint  ?Patient presents with  ? Fall off Ladder  ? ? ?Norman Anderson is a 56 y.o. male. ? ?56 y.o male with a PMH of cardiac stent placement presents to the ED s/p fall. Patient was at his home standing on top of a 6 foot ladder when he lost his balance and had the ladder go under him, he felt forward landing on a grassy area. The left side of his body took most of the impact from the fall. He reports hearing the sound of "like cracking bones", on the left of his chest. He is endorsing pain along his chest along with severe pain to his left shoulder, exacerbated with any type of movement or palpation. He did not strike his head, denies losing consciousness but did lay on the ground for approximately 5 minutes as he felt "the wind knocked out of me". He was able to stand up and take a hot bath prior to arrival to the ED. He has not taken any medication for improvement in symptoms. He denies any blood thinner use, headache, shortness of breath or other injury.  ? ?The history is provided by the patient and medical records.  ? ?  ? ?Home Medications ?Prior to Admission medications   ?Medication Sig Start Date End Date Taking? Authorizing Provider  ?cyclobenzaprine (FLEXERIL) 10 MG tablet Take 1 tablet (10 mg total) by mouth 2 (two) times daily as needed for up to 7 days for muscle spasms. 04/14/22 04/21/22 Yes Chayne Baumgart, Beverley Fiedler, PA-C  ?oxyCODONE-acetaminophen (PERCOCET/ROXICET) 5-325 MG tablet Take 1 tablet by mouth every 6 (six) hours as needed for up to 3 days for severe pain. 04/14/22 04/17/22 Yes Edyn Popoca, Beverley Fiedler, PA-C  ?aspirin 81 MG tablet Take 81 mg by mouth daily.    [provider]  ?divalproex (DEPAKOTE ER) 500 MG 24 hr tablet Take 2 tablets (1,000 mg total) by mouth daily. At bedtime 06/03/20   Copland, Gay Filler, MD  ?pantoprazole (PROTONIX) 40 MG tablet TAKE 1 TABLET  DAILY AS NEEDED FOR GERD 06/14/21   Copland, Gay Filler, MD  ?Pitavastatin Calcium (LIVALO) 2 MG TABS Take 1 tablet (2 mg total) by mouth daily. 07/17/20   Richardo Priest, MD  ?QUEtiapine (SEROQUEL) 50 MG tablet Take 1.5-2 tablets (75-100 mg total) by mouth at bedtime. 06/03/20   Copland, Gay Filler, MD  ?sacubitril-valsartan (ENTRESTO) 97-103 MG Take 1 tablet by mouth 2 (two) times daily. 07/17/20   Richardo Priest, MD  ?   ? ?Allergies    ?Patient has no known allergies.   ? ?Review of Systems   ?Review of Systems  ?Constitutional:  Negative for chills and fever.  ?HENT:  Negative for sore throat.   ?Respiratory:  Negative for shortness of breath.   ?Cardiovascular:  Positive for chest pain. Negative for leg swelling.  ?Gastrointestinal:  Negative for abdominal pain, nausea and vomiting.  ?Genitourinary:  Positive for flank pain.  ?Musculoskeletal:  Positive for arthralgias, back pain and neck pain.  ?Neurological:  Negative for light-headedness and headaches.  ?All other systems reviewed and are negative. ? ?Physical Exam ?Updated Vital Signs ?BP (!) 146/103   Pulse (!) 48   Temp 98.3 ?F (36.8 ?C) (Oral)   Resp 17   Ht '5\' 11"'$  (1.803 m)   Wt 108.9 kg   SpO2 95%   BMI 33.47 kg/m?  ?  Physical Exam ?Vitals and nursing note reviewed.  ?Constitutional:   ?   Appearance: Normal appearance.  ?HENT:  ?   Head: Normocephalic and atraumatic.  ?   Comments: No palpable goose eggs noted.  ?   Nose: Nose normal.  ?   Mouth/Throat:  ?   Mouth: Mucous membranes are moist.  ?Eyes:  ?   Pupils: Pupils are equal, round, and reactive to light.  ?   Comments: Pupils equal and reactive without any injection.   ?Neck:  ?   Comments: Aspen C collar placed by nursing staff upon arrival to the ED. Pain along the midline of the c spine present.  ?Cardiovascular:  ?   Rate and Rhythm: Normal rate.  ?Pulmonary:  ?   Effort: Pulmonary effort is normal.  ?   Breath sounds: No wheezing or rales.  ?   Comments: No Absent lung sounds, but effort  is slightly diminished.  ?Abdominal:  ?   General: Abdomen is flat.  ?   Palpations: Abdomen is soft.  ?   Tenderness: There is no abdominal tenderness. There is left CVA tenderness.  ?Musculoskeletal:     ?   General: Tenderness and signs of injury present.  ?   Left shoulder: Tenderness present. No deformity or effusion. Decreased range of motion. Normal strength. Normal pulse.  ?   Comments: Decrease ROM due to pain, exacerbated with over the head extension. Pulses present, strength 5/5 with flexion and extension. Pulses present.   ?Skin: ?   General: Skin is warm and dry.  ? ?    ?Neurological:  ?   Mental Status: He is alert and oriented to person, place, and time.  ? ? ?ED Results / Procedures / Treatments   ?Labs ?(all labs ordered are listed, but only abnormal results are displayed) ?Labs Reviewed  ?COMPREHENSIVE METABOLIC PANEL - Abnormal; Notable for the following components:  ?    Result Value  ? Glucose, Bld 132 (*)   ? All other components within normal limits  ?CBC - Abnormal; Notable for the following components:  ? WBC 11.7 (*)   ? All other components within normal limits  ?URINALYSIS, ROUTINE W REFLEX MICROSCOPIC  ?TROPONIN I (HIGH SENSITIVITY)  ?TROPONIN I (HIGH SENSITIVITY)  ? ? ?EKG ?EKG Interpretation ? ?Date/Time:  Thursday Apr 14 2022 18:09:36 EDT ?Ventricular Rate:  68 ?PR Interval:  158 ?QRS Duration: 105 ?QT Interval:  424 ?QTC Calculation: 451 ?R Axis:   -20 ?Text Interpretation: Sinus rhythm Inferior infarct, old No old tracing to compare Confirmed by Malvin Johns [phone]) on 04/14/2022 6:12:11 PM ? ?Radiology ?DG Clavicle Left ? ?Result Date: 04/14/2022 ?CLINICAL DATA:  Fall EXAM: LEFT CLAVICLE - 2+ VIEWS COMPARISON:  None Available. FINDINGS: There is no evidence of fracture or other focal bone lesions. Soft tissues are unremarkable. IMPRESSION: Negative. Electronically Signed   By: Donavan Foil M.D.   On: 04/14/2022 16:33  ? ?CT Head Wo Contrast ? ?Result Date: 04/14/2022 ?CLINICAL  DATA:  Fall from ladder EXAM: CT HEAD WITHOUT CONTRAST CT CERVICAL SPINE WITHOUT CONTRAST TECHNIQUE: Multidetector CT imaging of the head and cervical spine was performed following the standard protocol without intravenous contrast. Multiplanar CT image reconstructions of the cervical spine were also generated. RADIATION DOSE REDUCTION: This exam was performed according to the departmental dose-optimization program which includes automated exposure control, adjustment of the mA and/or kV according to patient size and/or use of iterative reconstruction technique. COMPARISON:  None Available. FINDINGS: CT HEAD  FINDINGS Brain: No evidence of acute infarction, hemorrhage, hydrocephalus, extra-axial collection or mass lesion/mass effect. Vascular: Negative for hyperdense vessel Skull: Negative for skull fracture Sinuses/Orbits: Paranasal sinuses clear. Mastoid clear. Negative orbit Other: None CT CERVICAL SPINE FINDINGS Alignment: Normal Skull base and vertebrae: Negative for fracture Soft tissues and spinal canal: No soft tissue mass or contusion. Disc levels:  Minimal degenerative change in the cervical spine Upper chest: Limited evaluation negative Other: None IMPRESSION: Negative CT head and cervical spine Electronically Signed   By: Franchot Gallo M.D.   On: 04/14/2022 17:23  ? ?CT Cervical Spine Wo Contrast ? ?Result Date: 04/14/2022 ?CLINICAL DATA:  Fall from ladder EXAM: CT HEAD WITHOUT CONTRAST CT CERVICAL SPINE WITHOUT CONTRAST TECHNIQUE: Multidetector CT imaging of the head and cervical spine was performed following the standard protocol without intravenous contrast. Multiplanar CT image reconstructions of the cervical spine were also generated. RADIATION DOSE REDUCTION: This exam was performed according to the departmental dose-optimization program which includes automated exposure control, adjustment of the mA and/or kV according to patient size and/or use of iterative reconstruction technique. COMPARISON:   None Available. FINDINGS: CT HEAD FINDINGS Brain: No evidence of acute infarction, hemorrhage, hydrocephalus, extra-axial collection or mass lesion/mass effect. Vascular: Negative for hyperdense vessel Skull: Ne

## 2022-04-14 NOTE — ED Notes (Signed)
Immediately placed on exam room for immediate evaluation, C-collar placed ?

## 2022-04-14 NOTE — ED Notes (Signed)
Denies LOC, denies being on blood thinners ?

## 2022-04-14 NOTE — ED Notes (Signed)
States was standing on the very top step of a 6 ft ladder and fell onto grassy area onto left shoulder, no loc ?

## 2022-04-14 NOTE — Discharge Instructions (Addendum)
I have prescribed muscle relaxers for your pain, please do not drink or drive while taking this medications as it can make you drowsy.  ? ?I have also prescribed pain medication to help with your ear pain, please take this medication as prescribed. ? ?You will need to follow-up with spine specialist in order to further evaluate your compression fractures.  In addition, I do suspect that you will likely need to follow-up with orthopedics for your left shoulder pain if this remains after finished your pain medication. ?

## 2022-04-14 NOTE — ED Triage Notes (Signed)
Fall from top of 8 ft ladder at 0800 this am  ?Denies LOC but states "got the wind knocked out of him"  ?Pain in left neck, left shoulder,and pack  ?Pt ambulatory  ?

## 2022-04-14 NOTE — ED Notes (Signed)
Pt resting, still has a lot of pain, physician notified. ?

## 2022-04-14 NOTE — ED Notes (Signed)
ED Provider at bedside. 

## 2022-04-14 NOTE — ED Notes (Addendum)
A= airway WNL ?B = WNL clear bilaterly ?C = WNL good cap refill, strong radial and pedal pulses, color WNL, temp WNL ?D = WNL, GCS 15 AVPU-Alert ?E = shirt taken off, gown in place ?F = vital signs stable ?G = comfort measures in place, assessed pain level ?H = abrasion noted on rt tibial, ant lower leg, no active bleeding noted, no bruising noted, skin WNL ?I = back area clear of signs of injury, no bruising or bleeding noted ?

## 2022-10-11 IMAGING — CT CT CERVICAL SPINE W/O CM
4 series · 15 of 33 positions shown, 18 images · non-contrast
Comparison: None Available.

CLINICAL DATA: Fall from ladder



[Series 3: c_spine 2.0 i30s 3 · axial · 0.42mm/px · z∈[-195,-69]mm · 5 of 95 slices shown, 7 images]
[im 16/95  soft-tissue]
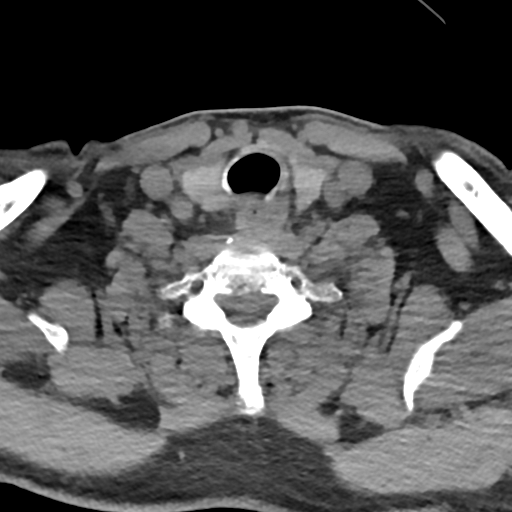
[im 16/95  bone]
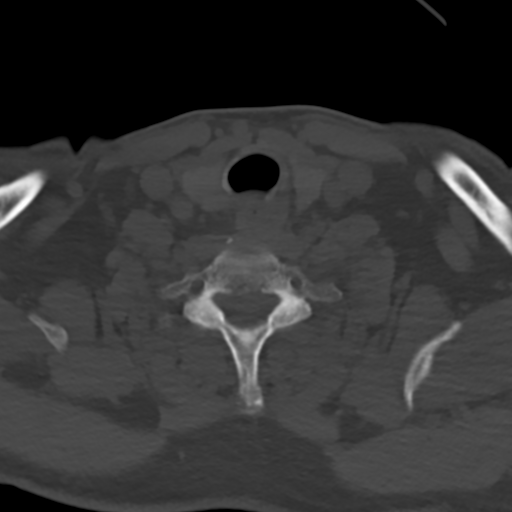
[im 32/95  bone]
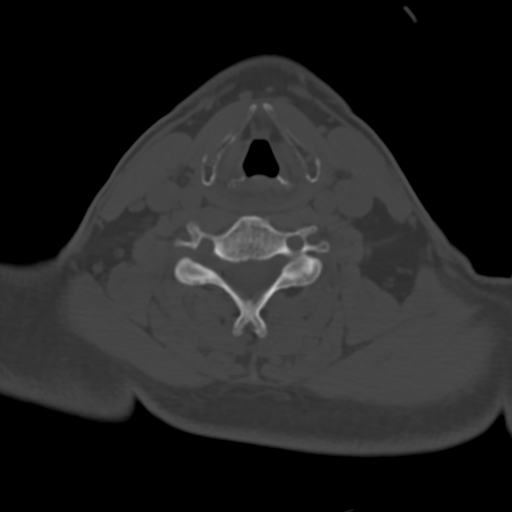
[im 48/95  bone]
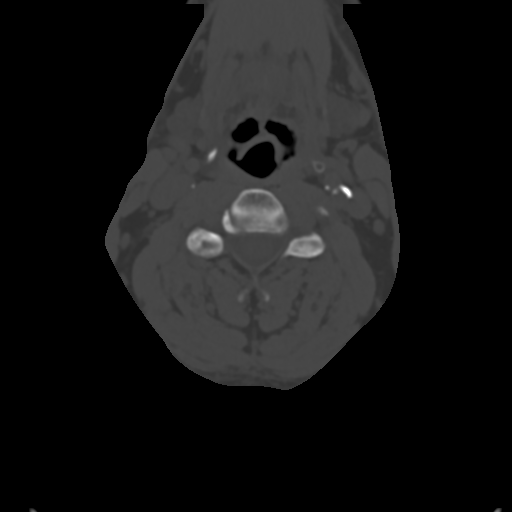
[im 63/95  bone]
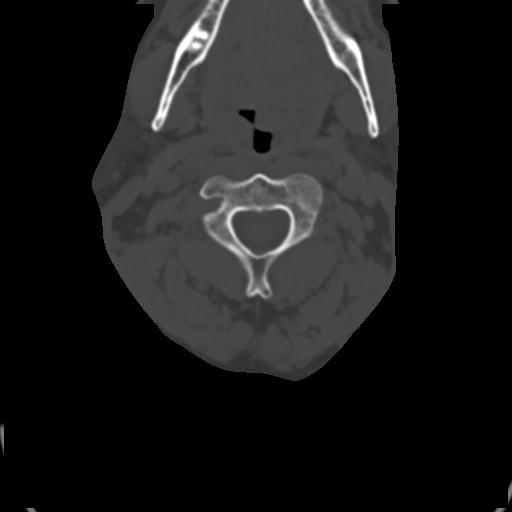
[im 79/95  soft-tissue]
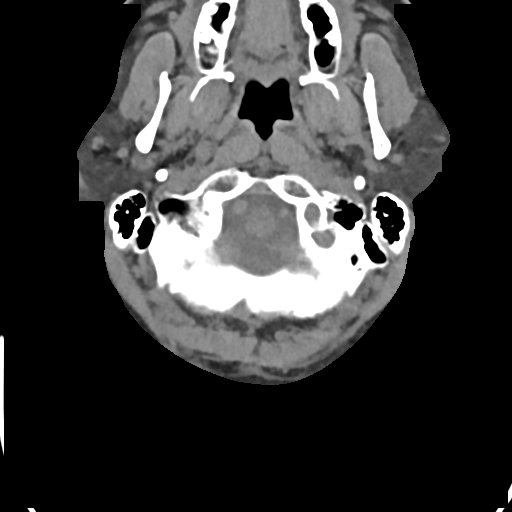
[im 79/95  bone]
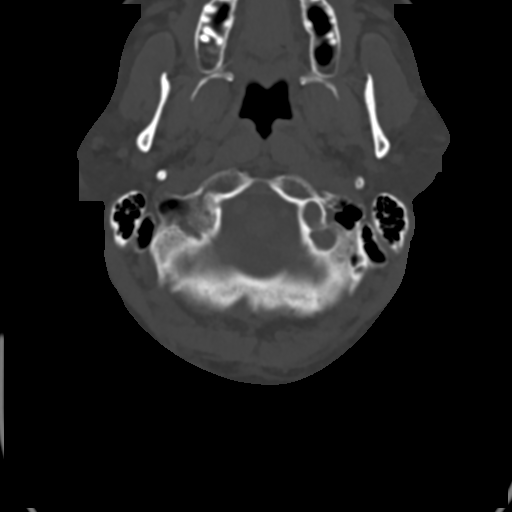

[Series 5: coronals · coronal · 0.28mm/px · 3 of 61 slices shown]
[im 13/61  bone]
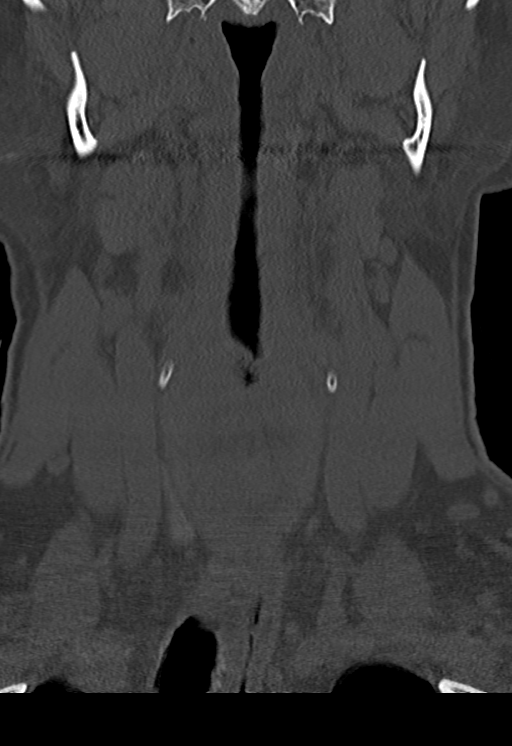
[im 25/61  bone]
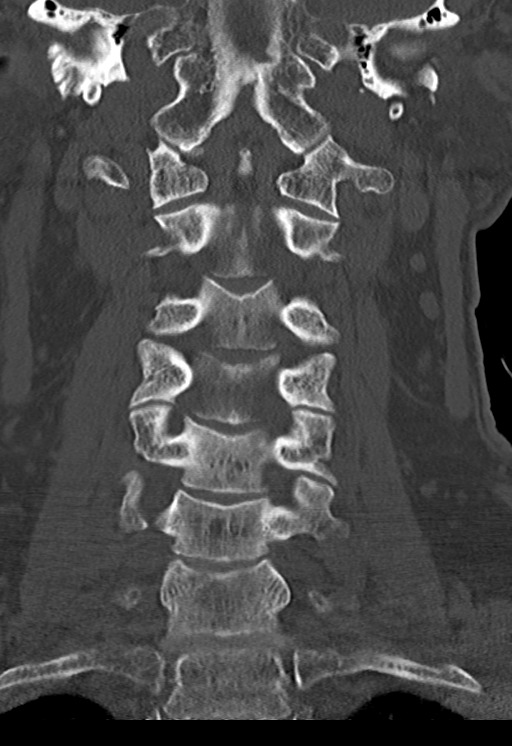
[im 37/61  bone]
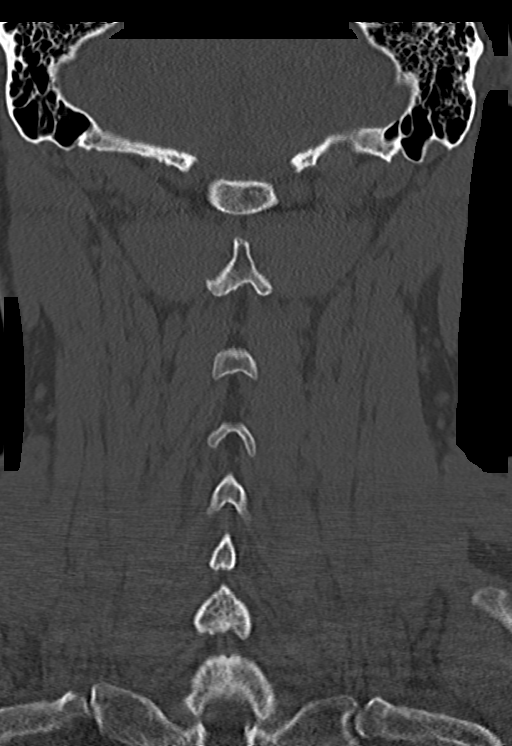

[Series 6: sagittals · sagittal · 0.37mm/px · 5 of 61 slices shown, 6 images]
[im 21/61  bone]
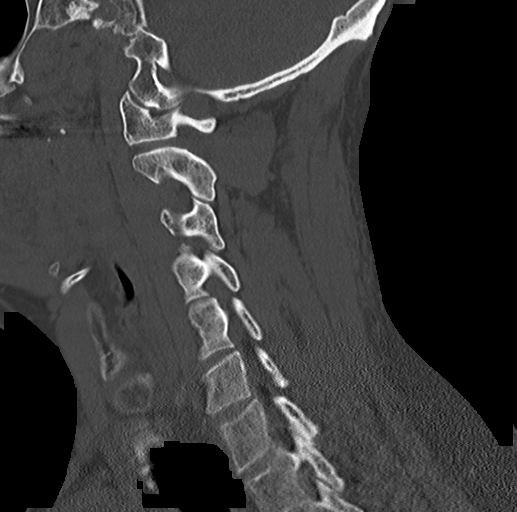
[im 26/61  bone]
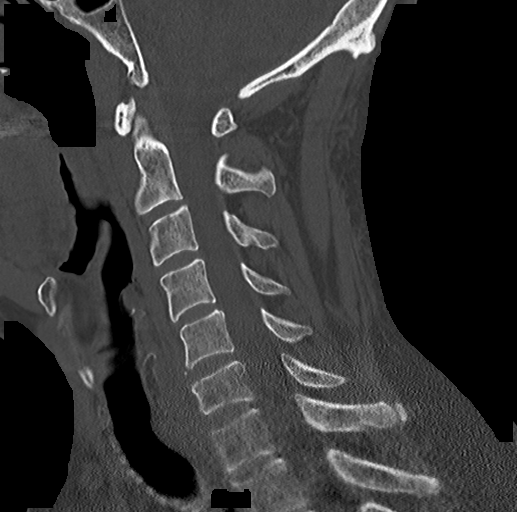
[im 31/61  soft-tissue]
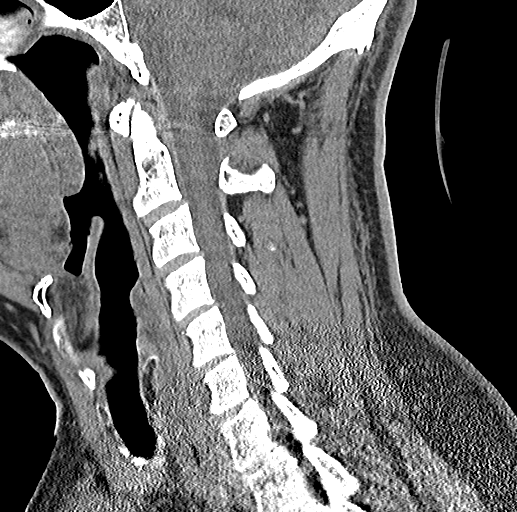
[im 31/61  bone]
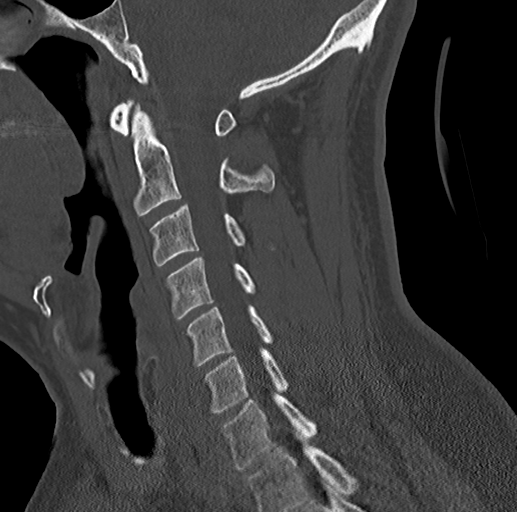
[im 36/61  bone]
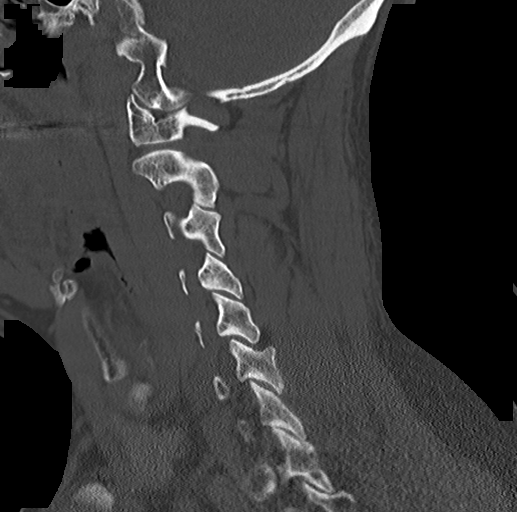
[im 41/61  bone]
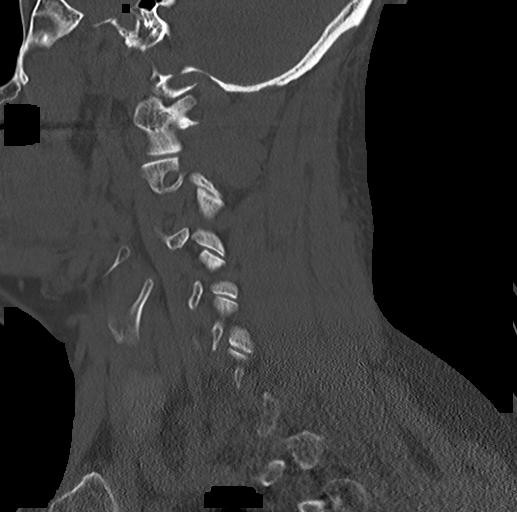

[Series 7: orthogonals · axial · 0.23mm/px · z∈[-211,-178]mm · 2 of 100 slices shown]
[im 17/100  bone]
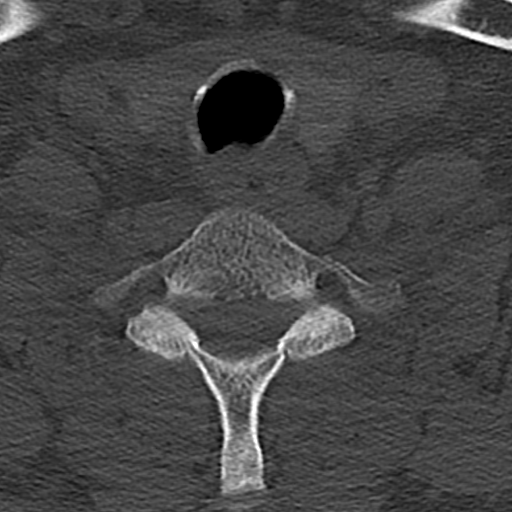
[im 34/100  bone]
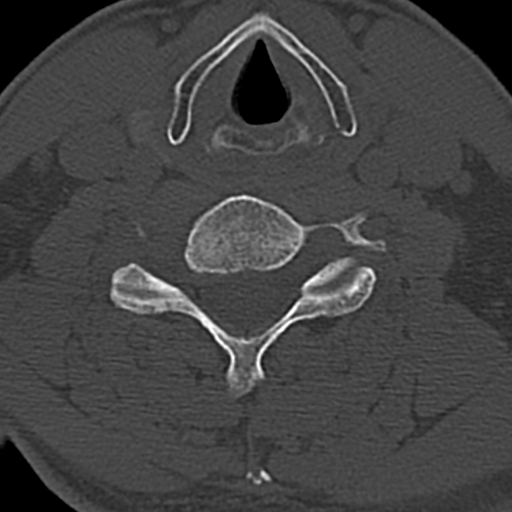

[15 of 33 positions shown; findings below may reference images not displayed]

FINDINGS: CT HEAD FINDINGS

Brain: No evidence of acute infarction, hemorrhage, hydrocephalus,
extra-axial collection or mass lesion/mass effect.

Vascular: Negative for hyperdense vessel

Skull: Negative for skull fracture

Sinuses/Orbits: Paranasal sinuses clear. Mastoid clear. Negative
orbit

Other: None

CT CERVICAL SPINE FINDINGS

Alignment: Normal

Skull base and vertebrae: Negative for fracture

Soft tissues and spinal canal: No soft tissue mass or contusion.

Disc levels:  Minimal degenerative change in the cervical spine

Upper chest: Limited evaluation negative

Other: None
IMPRESSION: Negative CT head and cervical spine

## 2022-10-11 IMAGING — CT CT L SPINE W/O CM
3 series · 12 of 33 positions shown, 14 images · IV contrast (Omnipaque)
Comparison: None Available.

CLINICAL DATA: Trauma.

EXAM:
CT  THORACIC AND LUMBAR SPINE WITHOUT CONTRAST
TECHNIQUE: Multidetector CT imaging of the thoracic and lumbar spine was
performed without intravenous contrast. Multiplanar CT image
reconstructions were also generated.
RADIATION DOSE REDUCTION: This exam was performed according to the
departmental dose-optimization program which includes automated
exposure control, adjustment of the mA and/or kV according to
patient size and/or use of iterative reconstruction technique.

[Series 1: axial st lumbar · axial · 0.29mm/px · z∈[-736,-522]mm · 4 of 155 slices shown, 5 images]
[im 24/155  soft-tissue]
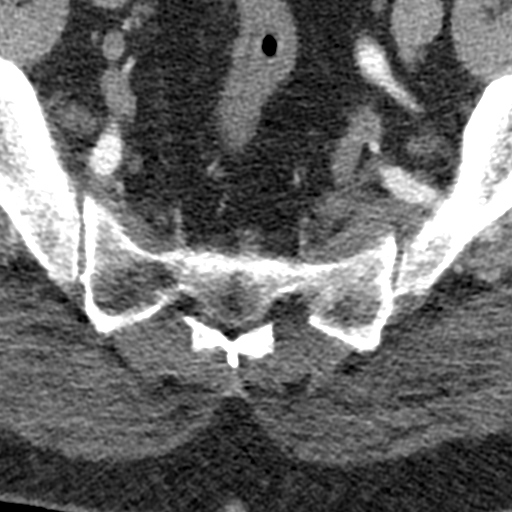
[im 24/155  bone]
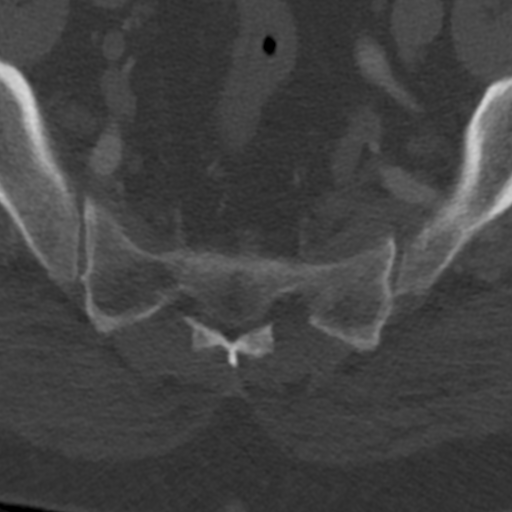
[im 60/155  bone]
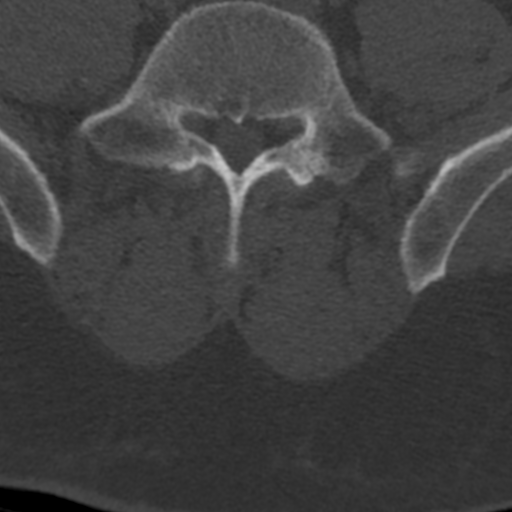
[im 95/155  bone]
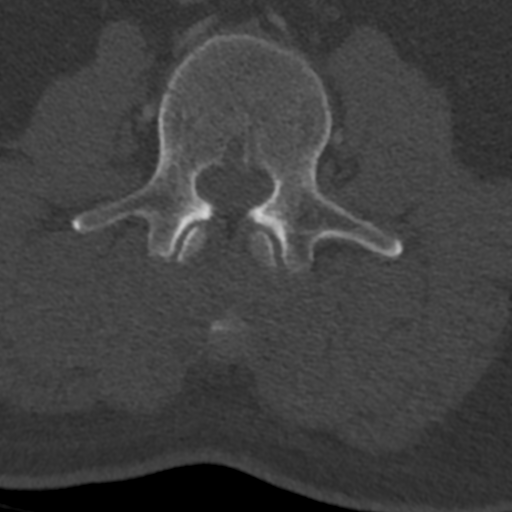
[im 131/155  bone]
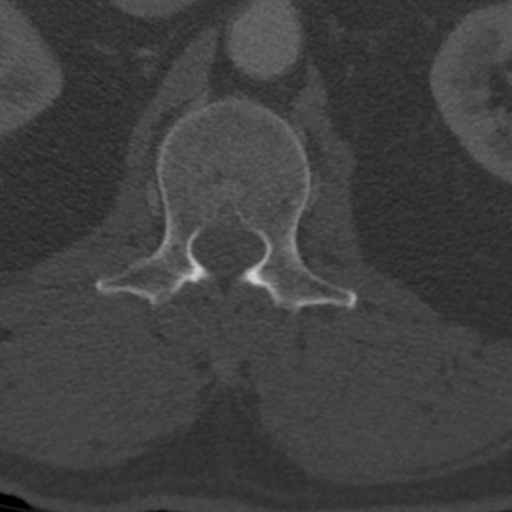

[Series 4: sagittals bone lumbar · sagittal · 0.46mm/px · 5 of 108 slices shown, 6 images]
[im 36/108  bone]
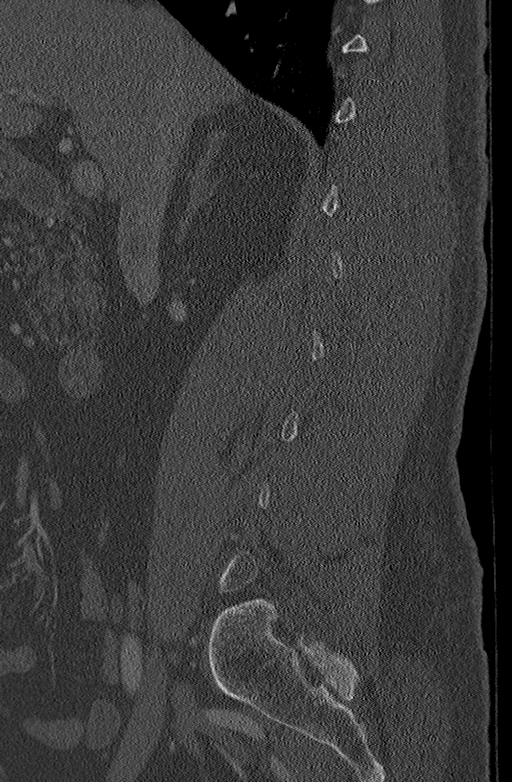
[im 45/108  bone]
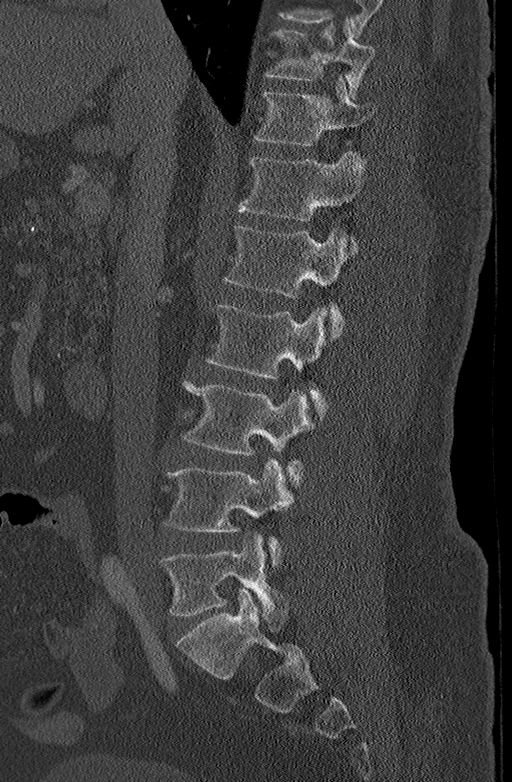
[im 54/108  soft-tissue]
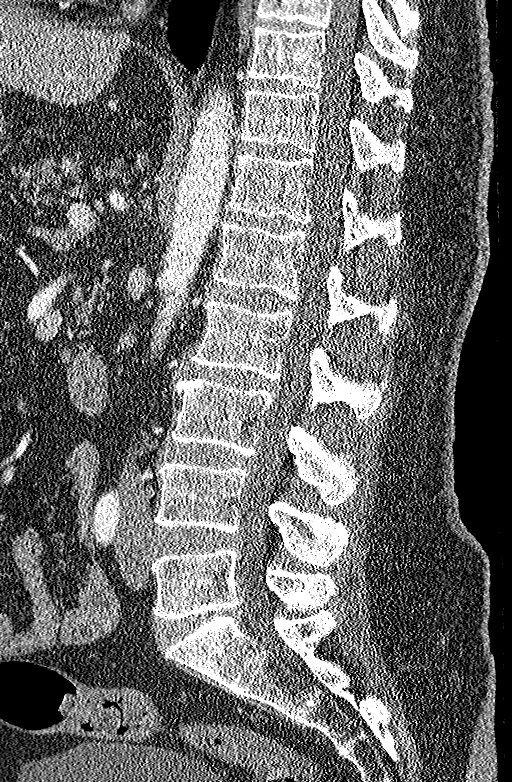
[im 54/108  bone]
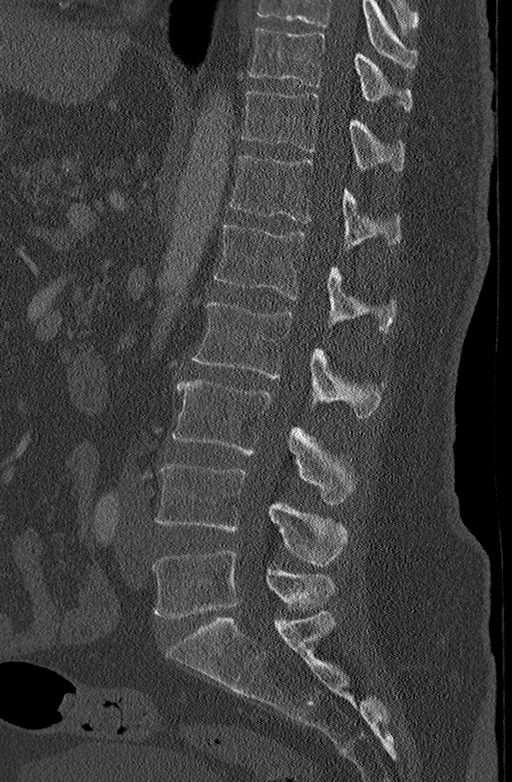
[im 63/108  bone]
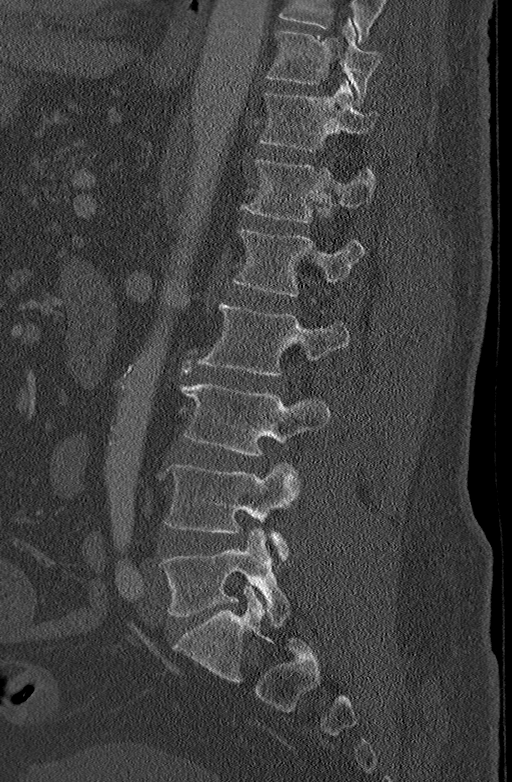
[im 72/108  bone]
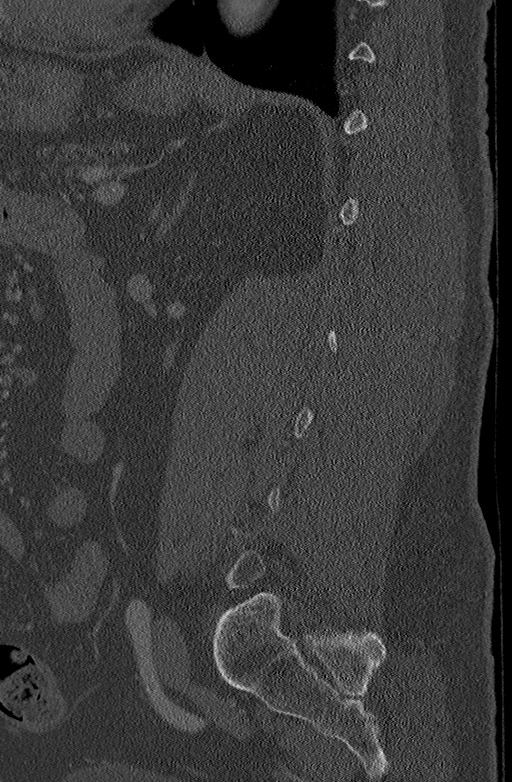

[Series 5: coronals lumbar bone · coronal · 0.36mm/px · 3 of 85 slices shown]
[im 17/85  bone]
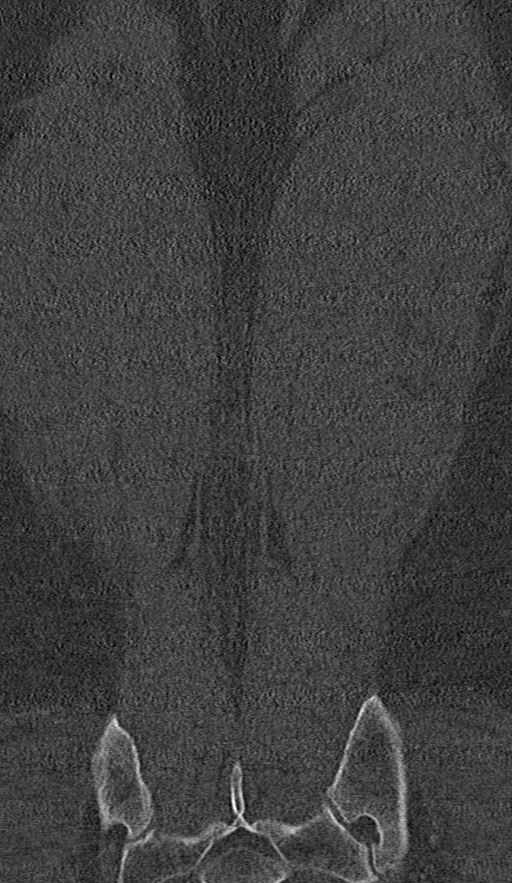
[im 34/85  bone]
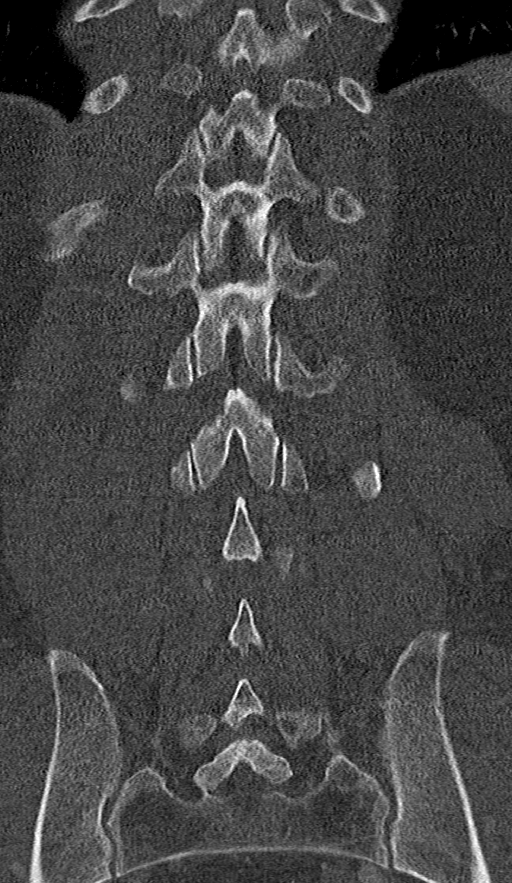
[im 51/85  bone]
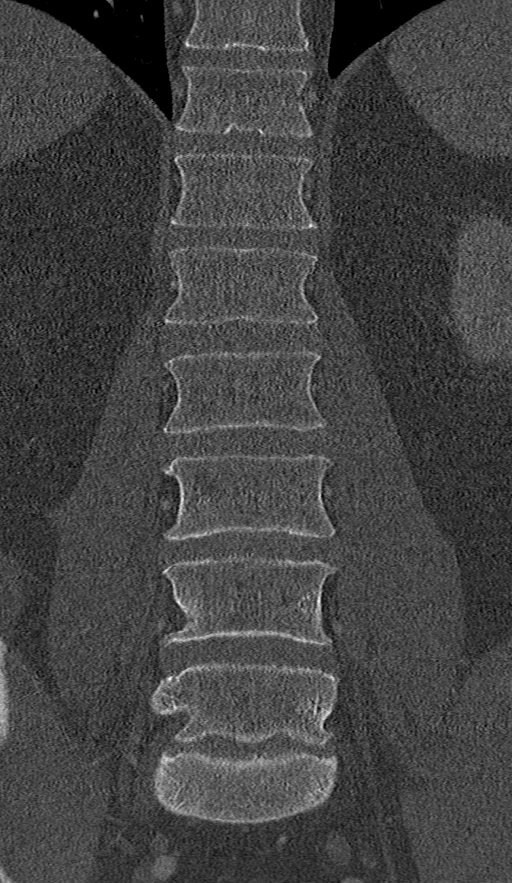

[12 of 33 positions shown; findings below may reference images not displayed]

FINDINGS: CT THORACIC SPINE FINDINGS

Alignment: Normal.

Vertebrae: Mild deformity and height loss of the T3 and T8 superior
plates, compatible with age indeterminate fractures. Approximately
30% height loss at T8 and 20% height loss at T3.

Paraspinal and other soft tissues: See concurrent CT
chest/abdomen/pelvis for intrathoracic evaluation.

Disc levels: Mild multilevel degenerative change with multiple small
Schmorl's nodes.

CT LUMBAR SPINE FINDINGS

Segmentation: Standard.

Alignment: No substantial sagittal subluxation.

Vertebrae: Lumbar vertebral body heights are maintained. No evidence
of acute fracture in the lumbar spine.

Paraspinal and other soft tissues: See concurrent CT
chest/abdomen/pelvis for intra-abdominal/intrapelvic evaluation.

Disc levels: Mild-to-moderate degenerative change at L5-S1 where
there is posterior disc height loss, endplate spurring. Suspected
disc bulge at L4-L5 where there is probably at least mild canal
stenosis.
IMPRESSION: 1. Mild superior endplate deformity and height loss of the T3 and T8
vertebral bodies, compatible with age indeterminate fractures.
Recommend correlation with the presence or absence of focal pain. An
MRI could better evaluate acuity if clinically warranted.
2. No traumatic malalignment.
3. Mild-to-moderate degenerative disc disease at L5-S1 and probably
at least mild canal stenosis at L4-L5.

## 2022-10-11 IMAGING — CT CT T SPINE W/O CM
3 series · 11 of 33 positions shown, 13 images · IV contrast (Omnipaque)
Comparison: None Available.

CLINICAL DATA: Trauma.

EXAM:
CT  THORACIC AND LUMBAR SPINE WITHOUT CONTRAST
TECHNIQUE: Multidetector CT imaging of the thoracic and lumbar spine was
performed without intravenous contrast. Multiplanar CT image
reconstructions were also generated.
RADIATION DOSE REDUCTION: This exam was performed according to the
departmental dose-optimization program which includes automated
exposure control, adjustment of the mA and/or kV according to
patient size and/or use of iterative reconstruction technique.

[Series 1: axial st thoracic · axial · 0.29mm/px · z∈[-464,-260]mm · 3 of 167 slices shown, 4 images]
[im 39/167  soft-tissue]
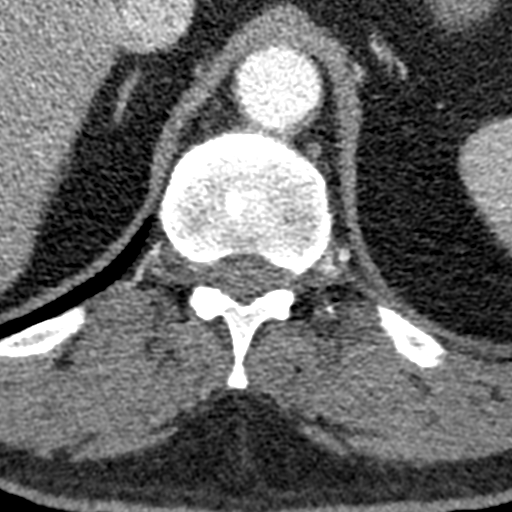
[im 39/167  bone]
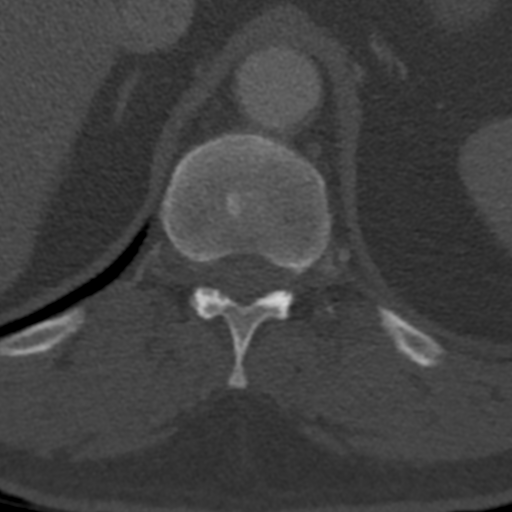
[im 90/167  bone]
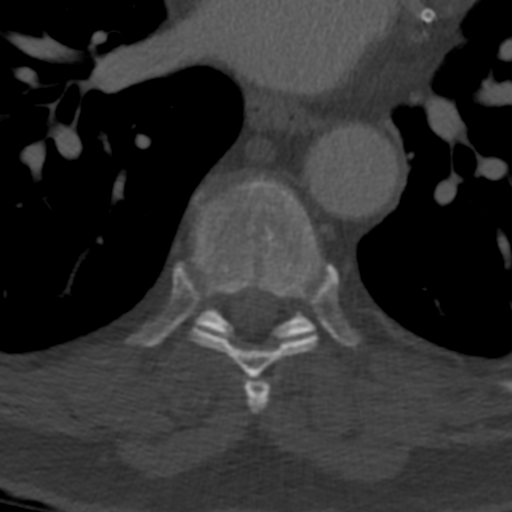
[im 141/167  bone]
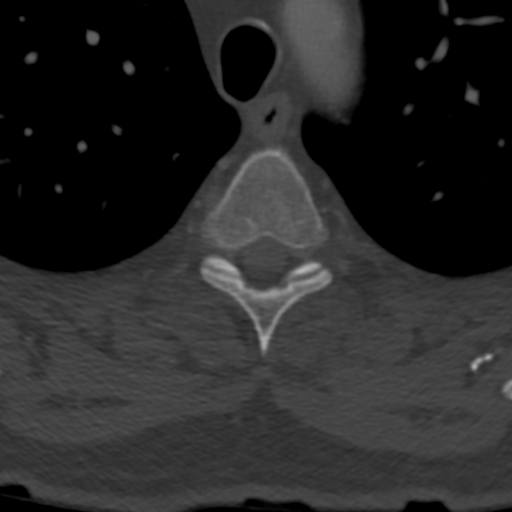

[Series 4: sagittals bone thoracic · sagittal · 0.46mm/px · 5 of 108 slices shown, 6 images]
[im 36/108  bone]
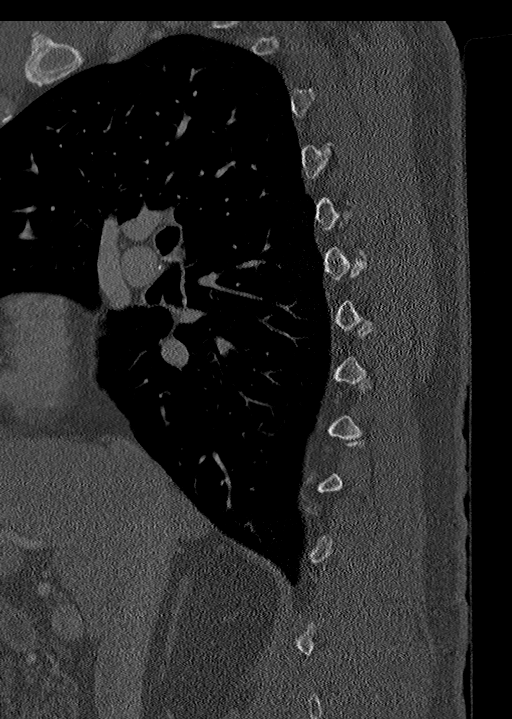
[im 45/108  bone]
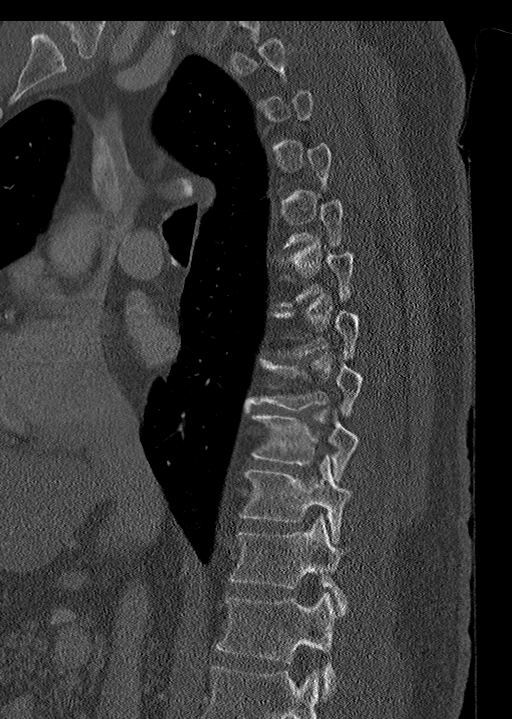
[im 54/108  soft-tissue]
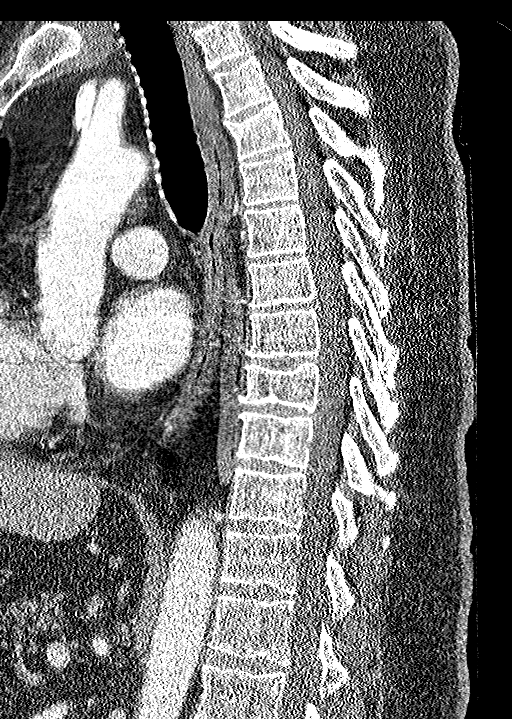
[im 54/108  bone]
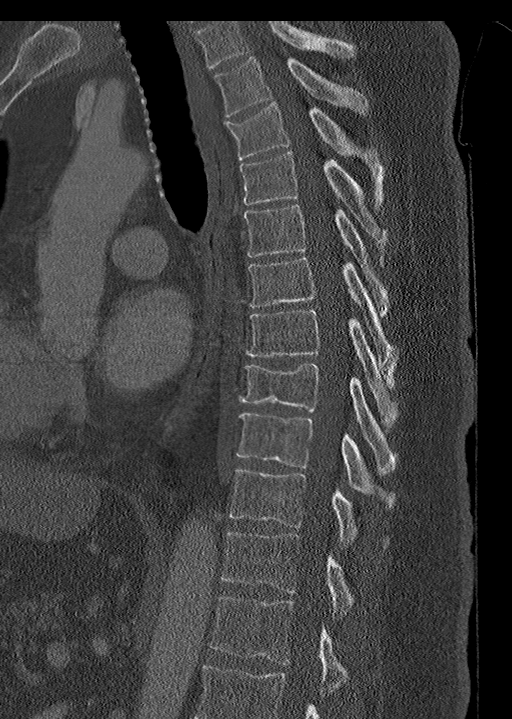
[im 63/108  bone]
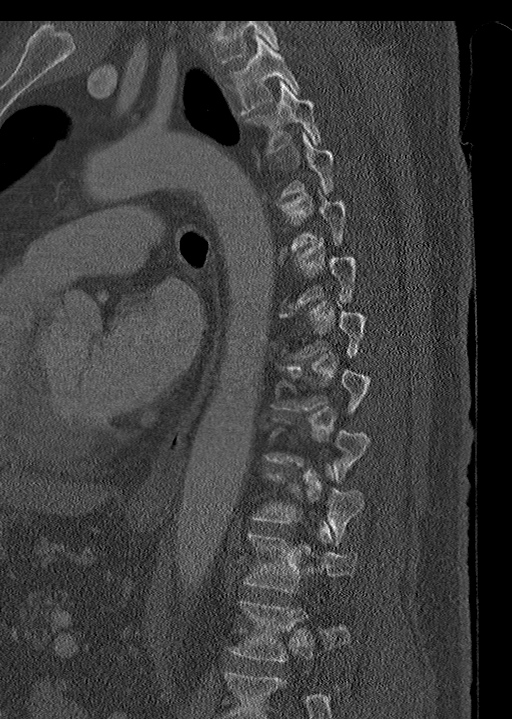
[im 72/108  bone]
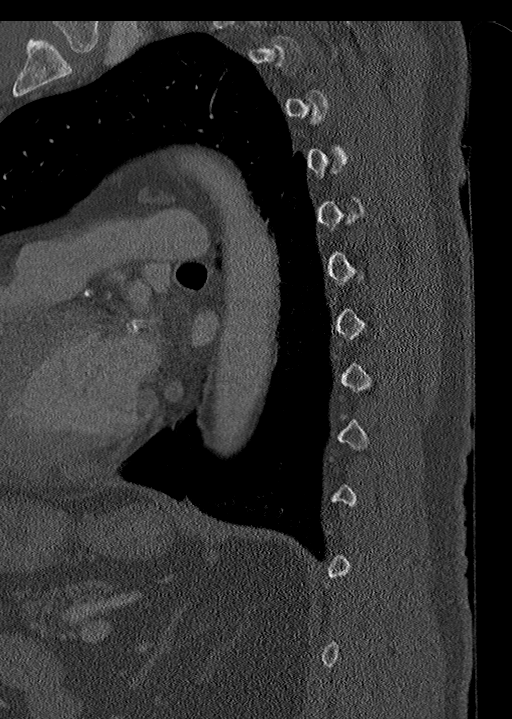

[Series 5: coronals thoracic bone · coronal · 0.36mm/px · 3 of 85 slices shown]
[im 17/85  bone]
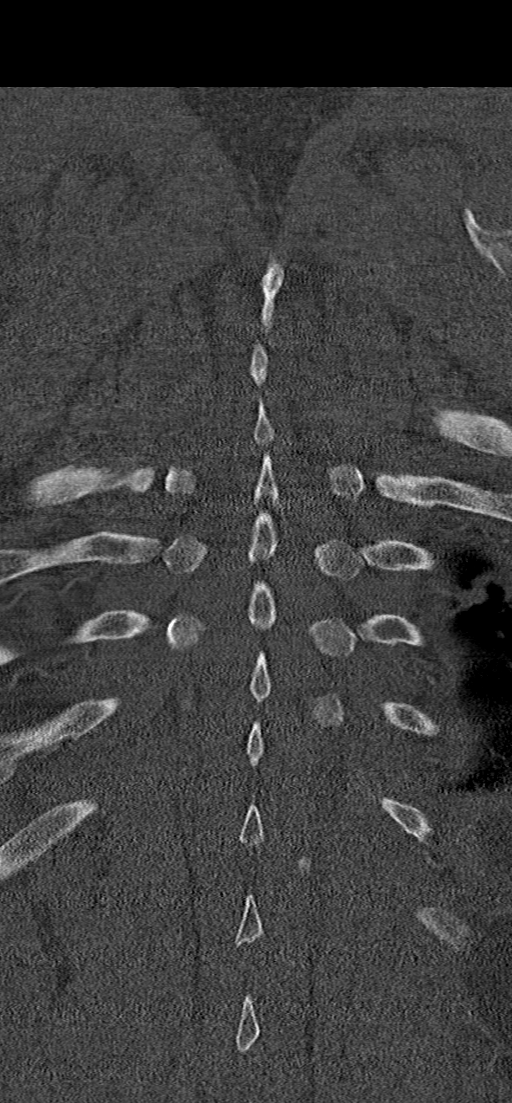
[im 34/85  bone]
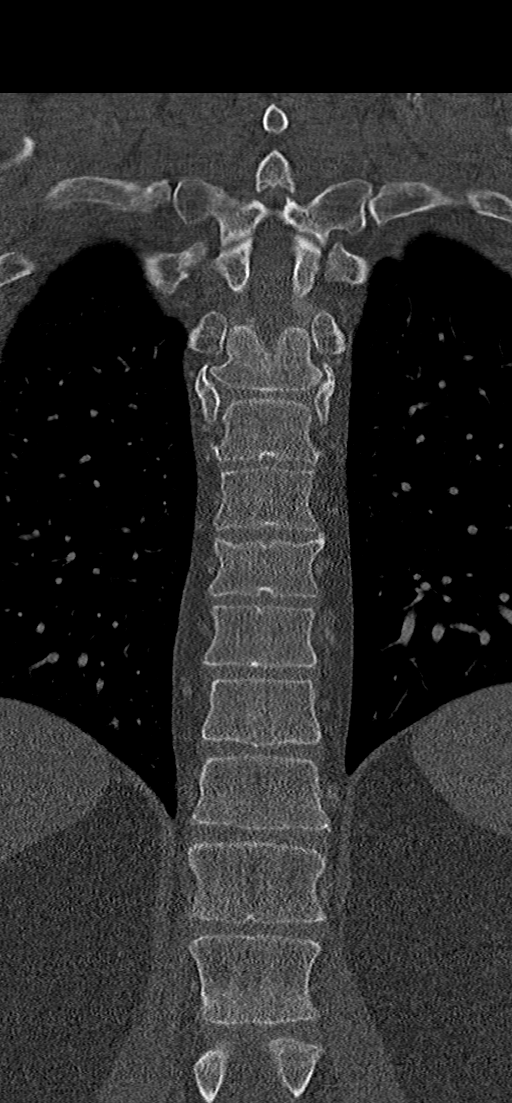
[im 51/85  bone]
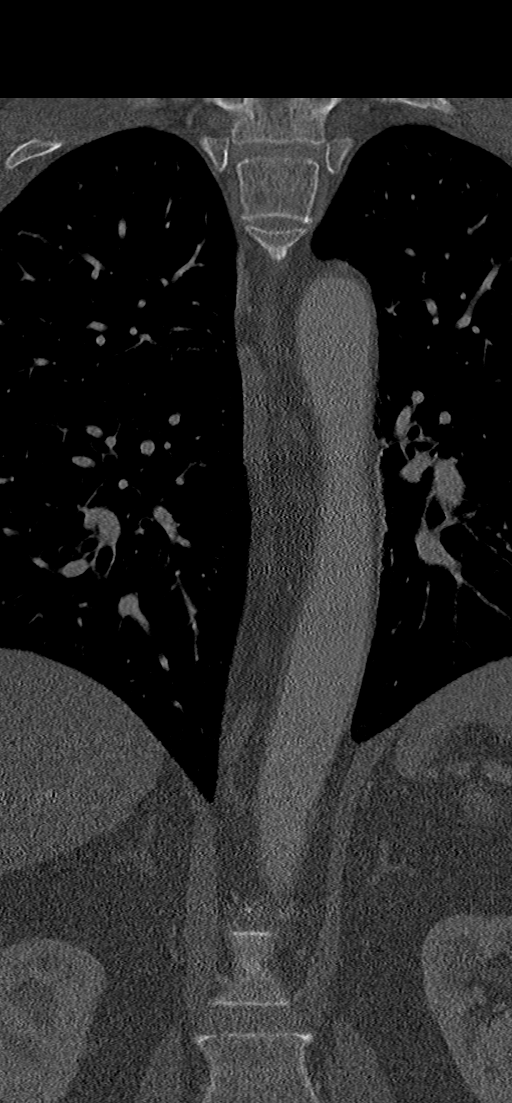

[11 of 33 positions shown; findings below may reference images not displayed]

FINDINGS: CT THORACIC SPINE FINDINGS

Alignment: Normal.

Vertebrae: Mild deformity and height loss of the T3 and T8 superior
plates, compatible with age indeterminate fractures. Approximately
30% height loss at T8 and 20% height loss at T3.

Paraspinal and other soft tissues: See concurrent CT
chest/abdomen/pelvis for intrathoracic evaluation.

Disc levels: Mild multilevel degenerative change with multiple small
Schmorl's nodes.

CT LUMBAR SPINE FINDINGS

Segmentation: Standard.

Alignment: No substantial sagittal subluxation.

Vertebrae: Lumbar vertebral body heights are maintained. No evidence
of acute fracture in the lumbar spine.

Paraspinal and other soft tissues: See concurrent CT
chest/abdomen/pelvis for intra-abdominal/intrapelvic evaluation.

Disc levels: Mild-to-moderate degenerative change at L5-S1 where
there is posterior disc height loss, endplate spurring. Suspected
disc bulge at L4-L5 where there is probably at least mild canal
stenosis.
IMPRESSION: 1. Mild superior endplate deformity and height loss of the T3 and T8
vertebral bodies, compatible with age indeterminate fractures.
Recommend correlation with the presence or absence of focal pain. An
MRI could better evaluate acuity if clinically warranted.
2. No traumatic malalignment.
3. Mild-to-moderate degenerative disc disease at L5-S1 and probably
at least mild canal stenosis at L4-L5.
